# Patient Record
Sex: Female | Born: 1970 | Race: White | Hispanic: No | Marital: Married | Smoking: Never smoker
Health system: Southern US, Community
[De-identification: ages and names within clinical notes are randomized; demographics above are authoritative.]

## PROBLEM LIST (undated history)

## (undated) DIAGNOSIS — F419 Anxiety disorder, unspecified: Secondary | ICD-10-CM

## (undated) DIAGNOSIS — O139 Gestational [pregnancy-induced] hypertension without significant proteinuria, unspecified trimester: Secondary | ICD-10-CM

## (undated) DIAGNOSIS — R569 Unspecified convulsions: Secondary | ICD-10-CM

## (undated) DIAGNOSIS — J189 Pneumonia, unspecified organism: Secondary | ICD-10-CM

---
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
## 2000-06-18 ENCOUNTER — Other Ambulatory Visit: Admission: RE | Admit: 2000-06-18 | Discharge: 2000-06-18 | Payer: Self-pay | Admitting: Obstetrics and Gynecology

## 2001-03-09 ENCOUNTER — Inpatient Hospital Stay (HOSPITAL_COMMUNITY): Admission: AD | Admit: 2001-03-09 | Discharge: 2001-03-09 | Payer: Self-pay | Admitting: Obstetrics and Gynecology

## 2001-03-28 ENCOUNTER — Ambulatory Visit (HOSPITAL_COMMUNITY): Admission: RE | Admit: 2001-03-28 | Discharge: 2001-03-28 | Payer: Self-pay | Admitting: Obstetrics & Gynecology

## 2001-11-06 ENCOUNTER — Other Ambulatory Visit: Admission: RE | Admit: 2001-11-06 | Discharge: 2001-11-06 | Payer: Self-pay | Admitting: Obstetrics & Gynecology

## 2002-07-02 HISTORY — PX: DILATION AND CURETTAGE OF UTERUS: SHX78

## 2003-02-02 ENCOUNTER — Other Ambulatory Visit: Admission: RE | Admit: 2003-02-02 | Discharge: 2003-02-02 | Payer: Self-pay | Admitting: Obstetrics & Gynecology

## 2004-02-15 ENCOUNTER — Other Ambulatory Visit: Admission: RE | Admit: 2004-02-15 | Discharge: 2004-02-15 | Payer: Self-pay | Admitting: Obstetrics & Gynecology

## 2005-02-20 ENCOUNTER — Other Ambulatory Visit: Admission: RE | Admit: 2005-02-20 | Discharge: 2005-02-20 | Payer: Self-pay | Admitting: Obstetrics & Gynecology

## 2007-07-03 DIAGNOSIS — J189 Pneumonia, unspecified organism: Secondary | ICD-10-CM

## 2007-07-03 HISTORY — DX: Pneumonia, unspecified organism: J18.9

## 2012-05-01 ENCOUNTER — Ambulatory Visit (HOSPITAL_COMMUNITY)
Admission: RE | Admit: 2012-05-01 | Discharge: 2012-05-01 | Disposition: A | Discharge status: Home / Self Care | Payer: BC Managed Care – PPO | Source: Ambulatory Visit | Attending: Obstetrics and Gynecology | Admitting: Obstetrics and Gynecology

## 2012-05-01 ENCOUNTER — Other Ambulatory Visit: Payer: Self-pay

## 2012-05-01 DIAGNOSIS — Z810 Family history of intellectual disabilities: Secondary | ICD-10-CM | POA: Insufficient documentation

## 2012-05-01 DIAGNOSIS — O09519 Supervision of elderly primigravida, unspecified trimester: Secondary | ICD-10-CM | POA: Insufficient documentation

## 2012-05-02 ENCOUNTER — Telehealth (HOSPITAL_COMMUNITY): Payer: Self-pay | Admitting: MS"

## 2012-05-02 ENCOUNTER — Other Ambulatory Visit (HOSPITAL_COMMUNITY): Payer: Self-pay | Admitting: Obstetrics and Gynecology

## 2012-05-02 DIAGNOSIS — Z810 Family history of intellectual disabilities: Secondary | ICD-10-CM | POA: Insufficient documentation

## 2012-05-02 DIAGNOSIS — O09519 Supervision of elderly primigravida, unspecified trimester: Secondary | ICD-10-CM | POA: Insufficient documentation

## 2012-05-02 DIAGNOSIS — O09529 Supervision of elderly multigravida, unspecified trimester: Secondary | ICD-10-CM

## 2012-05-02 NOTE — Telephone Encounter (Signed)
Called patient to discuss available dates to schedule chorionic villus sampling. Left message for patient to return call.   Grace Carter 3:11 PM 05/02/2012

## 2012-05-02 NOTE — Telephone Encounter (Signed)
Patient returned call regarding scheduling chorionic villus sampling (CVS) procedure in pregnancy. Discussed that first available date within the correct gestational age is 11/26 in [place]. Discussed that can be scheduled for afternoon. However, having procedure performed in the morning would make it more likely to have FISH result back next day. If performed in the afternoon, could still potentially have result back next day, but also possible that it would be within two days, which would be three days given that this falls on the Thanksgiving holiday. Discussed that there are a couple dates that Dr. Otho Perl would be available the week after, but the patient had indicated that she would like to obtain this information soon, if possible.  [name] planned to investigate what she could arrange at work and call back on Monday to schedule CVS.   [name] 05/02/2012 3:47 PM

## 2012-05-05 NOTE — Progress Notes (Signed)
Genetic Counseling  High-Risk Gestation Note  Appointment Date:  05/01/2012 Referred By: Grace Aspen, DO Date of Birth:  19-Jun-1971 Partner:  Grace Carter    Pregnancy History: [id number] Estimated Date of Delivery: 12/18/2012 Estimated Gestational Age: [redacted]w[redacted]d Attending: Particia Nearing, Md    [name] and her husband, Mr. Grace Carter, were seen for genetic counseling regarding a maternal age of 41 y.o.Marland Kitchen  They were counseled regarding maternal age and the association with risk for chromosome conditions due to nondisjunction with aging of the ova.   We reviewed chromosomes, nondisjunction, and the associated 1 in 22 risk for fetal aneuploidy at [redacted]w[redacted]d gestation related to a maternal age of 41 y.o. at delivery.  They were counseled that the risk for aneuploidy decreases as gestational age increases, accounting for those pregnancies which spontaneously abort.  We specifically discussed Down syndrome (trisomy 58), trisomies 30 and 59, and sex chromosome aneuploidies (47,XXX and 47,XXY) including the common features and prognoses of each.   We reviewed available screening options including noninvasive prenatal testing (NIPT), first trimester screening, and detailed ultrasound.  Specifically, we discussed that NIPT analyzes cell free fetal DNA found in the maternal circulation starting at approximately [redacted] weeks gestation. This test is not diagnostic for chromosome conditions, but can provide information regarding the presence or absence of extra fetal DNA for chromosomes 13, 18, 21, X, and Y, and missing fetal DNA for chromosome X and Y (Turner syndrome). Thus, it would not identify or rule out all genetic conditions. The reported detection rate is greater than 99% for Trisomy 21, greater than 98% for Trisomy 18, and is approximately 80% (8 out of 10) for Trisomy 13. The false positive rate is reported to be less than 0.1% for any of these conditions.  In addition, we discussed that ~50-80% of  fetuses with Down syndrome and up to 90-95% of fetuses with trisomy 18/13, when well visualized, have detectable anomalies or soft markers by detailed ultrasound (~18+ weeks gestation).   This couple was also counseled regarding diagnostic testing via chorionic villus sampling and amniocentesis for fetal chromosome conditions.  We reviewed the approximate 1 in 100 risk for complications associated with CVS and approximate 1 in 300-500 risk for complications associated with amniocentesis, including spontaneous pregnancy loss. We discussed the associated risk for maternal cell contamination and confined placental mosaicism. The couple understands that CVS and amniocentesis cannot diagnose or rule out all birth defects or genetic conditions and that standard karyotype analysis is not assessing for single gene conditions.  After consideration of all the options, the couple elected to proceed with CVS for karyotype analysis, which was scheduled for Tuesday, 05/27/12.   [name] was provided with written information regarding cystic fibrosis (CF) including the carrier frequency and incidence in the Caucasian population, the availability of carrier testing and prenatal diagnosis if indicated.  In addition, we discussed that CF is routinely screened for as part of the [facility] newborn screening panel.  She elected to proceed with CF carrier screening at the time of today's visit.    Both family histories were reviewed and found to be contributory for mental retardation for the patient's maternal great-uncle (her maternal grandmother's brother). She reported that he was able to communicate with others. He was otherwise healthy, and an underlying cause for his mental retardation was not known. We discussed that there are many different causes of mental retardation such as genetic differences, sporadic causes, or injuries.  A specific diagnosis for mental retardation can  be determined in approximately 50% of cases.  In the  remaining 50% of cases, a diagnosis may not ever be determined.  Without more specific information, potential genetic risks cannot be determined. We discussed that carrier screening for fragile X syndrome is available for the patient, which is a common type of inherited mental retardation syndrome that may also present as developmental delays and/or autism and is more common in males.  Affected males are commonly related through (unaffected or mildly affected) carrier females.  Carrier screening is used to help identify those individuals that may be at increased risk to have affected children, although the information it can provided is limited or questionable in some instances and has the potential to reveal personal predispositions for some health problems in carriers that some people may or may not want to know about themselves.  After careful consideration, [name] elected to proceed with Fragile X carrier screening at the time of today's visit.   Additionally, the patient reported her paternal grandparents possibly had a history of three pregnancy losses. An underlying cause was not known. Approximately 1 in 6 confirmed pregnancies results in miscarriage. A single underlying cause is more likely to be suspected when a couple has experienced 3 or more losses. It is less likely that there will be an identifiable single underlying cause when a couple has experienced less than 3 losses. Several possible causes include chromosome rearrangements, antibodies, thrombophilia, as well as maternal health conditions. A family history of recurrent miscarriage typically does not have implications for extended relatives. However, additional information is needed to accurately assess potential implications for relatives.  Without further information regarding the provided family history, an accurate genetic risk cannot be calculated. Further genetic counseling is warranted if more information is  obtained.  Carrier screening for Spinal Muscular Atrophy (SMA) was also discussed. SMA is a genetic condition that follows autosomal recessive inheritance, meaning both parents must be carriers in order for a pregnancy to have a 1 in 4 (25%) chance to inherit the condition. SMA is characterized by progressive weakness of voluntary muscles. The age of onset and severity of symptoms is variable, with the most common type leading to severe involvement by age 79 years. However, we discussed that genotype:phenotype correlation is not straightforward. Approximately 1 in 87 individuals in the Caucasian population are carriers. Carrier screening detects approximately 95% of carriers in the Caucasian population. Therefore, screening can reduce but not eliminate the chance to be a carrier. If one partner is an SMA carrier, then carrier screening would be offered to the other partner. After careful consideration, [name] elected to proceed with SMA carrier screening at the time of today's visit.   In addition to ethnicity based carrier screening, the couple inquired about additional possible genetic testing that would be available for them. We discussed a newer testing option, pan-ethnic carrier screening. This carrier screen, Inheritest offered through [employer], provides carrier screening for common disease-causing mutations of greater than 90 autosomal recessive conditions. We reviewed that the prevalence of each condition varies (and often varies with ethnicity). Thus the couples' background risk to be a carrier for each of these various conditions would range, and in some cases be very low or unknown. Similarly, the detection rate varies with each condition and also varies in some cases with ethnicity, ranging from greater than 99% (in the case of Hemoglobinopathies) to 11% to unknown. We reviewed that a negative carrier screen would thus reduce but not eliminate  the chance to be a carrier for  these conditions.  For some conditions included on this pan-ethnic carrier screening panel, the pre-test carrier frequency and/or the detection rate is unknown. Thus, for some conditions included on the pan-ethnic carrier screening panel, the exact reduction of risk with a negative carrier screening result may not be able to be quantified. We reviewed that in the event that one partner is found to be a carrier for one or more conditions, carrier screening would be available to the partner for those conditions. The couple was provided with written information regarding pan-ethnic carrier screening option including a list of the conditions included on the screening panel. We discussed the risks, benefits, and limitations of carrier screening with the couple. They planned to further consider this option and declined pan-ethnic carrier screening (Inheritest) at this time.   We discussed that the turnaround time for carrier screening for CF, SMA, and Fragile X is approximately 5-8 business days. We will contact the patient when results of cystic fibrosis carrier screening, Fragile X carrier screening, and spinal muscular atrophy carrier screening are available and forward results to her OB office.   [name] denied exposure to environmental toxins or chemical agents. She denied the use of alcohol, tobacco or street drugs. She denied significant viral illnesses during the course of her pregnancy. Her medical and surgical histories were contributory for anxiety for which she is currently treated with sertraline (100 mg/day). Before assessing any risk to a pregnancy, it is important to know that any time a couple conceives, there is a 3-5% chance that the baby will be affected by a birth defect or have mental retardation. Many factors contribute to what the effects, if any, a prenatal exposure will have on a pregnancy. Based on available study data of use on Zoloft during human pregnancy, no apparent  association with increased risk of birth defects is suggested. Use of Zoloft and other serotonin reuptake inhibitors late in gestation has been associated with an increased risk of mild transient neonatal syndrome of central nervous system including irritability, vomiting, jitteriness and convulsions, as well as neonatal pulmonary hypertension. These associated symptoms typically do not appear to occur frequently or severely. Even though a limited number of medicines are known to cause birth defects, we cannot say that it is completely safe to use any medicines during pregnancy.  It is also not possible to predict any drug-drug interactions that occur, or how they might affect a pregnancy.  The use of medications is recommended in pregnancy only if the benefit to the mother (and thus the pregnancy) outweighs potential risks to the baby.  Sometimes the maternal use of medications, dictated by a medical condition, may even be more beneficial to a pregnancy than not taking the medication(s) at all. Mrs. Lasure should not alter her medication use without first consulting her physician.   I counseled this couple regarding the above risks and available options.  The approximate face-to-face time with the genetic counselor was 80 minutes.    [name], MS,  Certified Genetic Counselor 05/05/2012

## 2012-05-12 ENCOUNTER — Telehealth (HOSPITAL_COMMUNITY): Payer: Self-pay | Admitting: MS"

## 2012-05-12 NOTE — Telephone Encounter (Signed)
Jamse Belfast, husband of SMT. LODER returning my call. Patient had previously given permission to contact her husband with results of testing. Discussed that results of cystic fibrosis carrier screening, spinal muscular atrophy carrier screening, and fragile X carrier testing were all negative.   CF carrier screening was negative for the 97 mutations analyzed. This reduces Mrs. Cowies a priori risk to be a CF carrier to approximately 1 in 343, which significantly reduces the chance for CF in the pregnancy.   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 Thus, her risk to be a carrier of SMA is reduced to approximately 1 in 834.   Fragile X carrier screening was negative for the fragile X expansion mutation.   All questions were answered to Mr. Lentz's satisfaction. He and his wife were encouraged to call back with additional questions or concerns.   Clydie Braun Mariel Lukins 05/12/2012 4:53 PM

## 2012-05-12 NOTE — Telephone Encounter (Signed)
Left message for patient to return call.   Grace Carter 05/12/2012 4:48 PM

## 2012-05-20 ENCOUNTER — Other Ambulatory Visit: Payer: Self-pay

## 2012-05-20 LAB — OB RESULTS CONSOLE RUBELLA ANTIBODY, IGM: Rubella: IMMUNE

## 2012-05-20 LAB — OB RESULTS CONSOLE GC/CHLAMYDIA: Chlamydia: NEGATIVE

## 2012-05-20 LAB — OB RESULTS CONSOLE RPR: RPR: NONREACTIVE

## 2012-05-20 LAB — OB RESULTS CONSOLE HEPATITIS B SURFACE ANTIGEN: Hepatitis B Surface Ag: NEGATIVE

## 2012-05-27 ENCOUNTER — Ambulatory Visit (HOSPITAL_COMMUNITY)
Admission: RE | Admit: 2012-05-27 | Discharge: 2012-05-27 | Disposition: A | Discharge status: Home / Self Care | Payer: BC Managed Care – PPO | Source: Ambulatory Visit | Attending: Obstetrics and Gynecology | Admitting: Obstetrics and Gynecology

## 2012-05-27 ENCOUNTER — Encounter (HOSPITAL_COMMUNITY): Payer: Self-pay

## 2012-05-27 ENCOUNTER — Other Ambulatory Visit: Payer: Self-pay

## 2012-05-27 DIAGNOSIS — O09529 Supervision of elderly multigravida, unspecified trimester: Secondary | ICD-10-CM

## 2012-05-27 DIAGNOSIS — Z3689 Encounter for other specified antenatal screening: Secondary | ICD-10-CM | POA: Insufficient documentation

## 2012-05-28 ENCOUNTER — Telehealth (HOSPITAL_COMMUNITY): Payer: Self-pay | Admitting: MS"

## 2012-05-28 ENCOUNTER — Other Ambulatory Visit: Payer: Self-pay

## 2012-05-28 NOTE — Telephone Encounter (Signed)
Called Grace Carter's husband, [name], to discuss the preliminary FISH results from her CVS. Patient previously requested that her husband be contacted with testing results from CVS. We reviewed that these are within normal limits.  We again discussed the limitations of FISH and that final results are still pending and will be available in 1-2 weeks.  Patient reportedly has not noticed any concerning symptoms or complications following the procedure.  All questions were answered to the couple's satisfaction, and they were encouraged to call with additional questions or concerns.  [name], MS Certified Genetic Counselor 05/28/2012 11:31 AM

## 2012-06-03 ENCOUNTER — Telehealth (HOSPITAL_COMMUNITY): Payer: Self-pay | Admitting: MS"

## 2012-06-03 NOTE — Telephone Encounter (Signed)
Called Grace Carter's husband, Grace Carter to discuss the final karyotype results from her CVS. We reviewed that these are within normal limits.  We reviewed the options of maternal serum AFP screening in the second trimester for open neural tube defects as well as targeted ultrasound in the second trimester. We are happy to perform anatomy ultrasound, if desired. All questions were answered to his satisfaction, Grace Carter were encouraged to call with additional questions or concerns.  Grace Plowman, MS Certified Genetic Counselor 06/03/2012 3:16 PM

## 2012-10-30 ENCOUNTER — Inpatient Hospital Stay (HOSPITAL_COMMUNITY)
Admission: AD | Admit: 2012-10-30 | Discharge: 2012-10-30 | Disposition: A | Discharge status: Home / Self Care | Payer: BC Managed Care – PPO | Source: Ambulatory Visit | Attending: Obstetrics and Gynecology | Admitting: Obstetrics and Gynecology

## 2012-10-30 ENCOUNTER — Encounter (HOSPITAL_COMMUNITY): Payer: Self-pay | Admitting: *Deleted

## 2012-10-30 DIAGNOSIS — O133 Gestational [pregnancy-induced] hypertension without significant proteinuria, third trimester: Secondary | ICD-10-CM

## 2012-10-30 DIAGNOSIS — O139 Gestational [pregnancy-induced] hypertension without significant proteinuria, unspecified trimester: Secondary | ICD-10-CM | POA: Insufficient documentation

## 2012-10-30 HISTORY — DX: Gestational (pregnancy-induced) hypertension without significant proteinuria, unspecified trimester: O13.9

## 2012-10-30 LAB — COMPREHENSIVE METABOLIC PANEL
ALT: 7 U/L (ref 0–35)
Calcium: 8.7 mg/dL (ref 8.4–10.5)
Creatinine, Ser: 0.7 mg/dL (ref 0.50–1.10)
GFR calc Af Amer: >90 mL/min (ref >90)
Glucose, Bld: 71 mg/dL (ref 70–99)
Sodium: 136 mEq/L (ref 135–145)
Total Protein: 5.8 g/dL — ABNORMAL LOW (ref 6.0–8.3)

## 2012-10-30 LAB — CBC
Hemoglobin: 11.3 g/dL — ABNORMAL LOW (ref 12.0–15.0)
MCH: 27.9 pg (ref 26.0–34.0)
MCHC: 33.5 g/dL (ref 30.0–36.0)
MCV: 83.2 fL (ref 78.0–100.0)

## 2012-10-30 NOTE — MAU Note (Signed)
Pt states she was seen in the MD's.offfice and sent over for further mointioring

## 2012-10-30 NOTE — Progress Notes (Signed)
BP resulted with Dr Henderson Cloud, Pt for discharge home with a 24hour urine collection to  Start tonight and return to MAU tomorrow with collection and BP check

## 2012-10-30 NOTE — MAU Provider Note (Signed)
History     CSN: [id number]  Arrival date and time: 10/30/12 1626   None     Chief Complaint  Patient presents with  . Hypertension   HPI  Pt is a G2P0010 at 33.5 wks IUP here sent from the office for evaluation of elevated blood pressure.  Pt reports in office blood pressure was 140/90's.  Urinalysis in office negative for protein.  Reports headache last night.  No headache at this time.    No epigastic pain or vision changes.  Denies contractions, leaking of fluid, or vaginal bleeding.    Past Medical History  Diagnosis Date  . Pregnancy induced hypertension     Past Surgical History  Procedure Laterality Date  . No past surgeries      Family History  Problem Relation Age of Onset  . Hypertension Father     History  Substance Use Topics  . Smoking status: Not on file  . Smokeless tobacco: Not on file  . Alcohol Use: Not on file    Allergies:  Allergies  Allergen Reactions  . Penicillins     seizures    Prescriptions prior to admission  Medication Sig Dispense Refill  . acetaminophen (TYLENOL) 325 MG tablet Take 650 mg by mouth every 6 (six) hours as needed for pain.      . Prenatal Vit-Fe Fumarate-FA (PRENATAL MULTIVITAMIN) TABS Take 1 tablet by mouth daily at 12 noon.      . ranitidine (ZANTAC) 150 MG tablet Take 150 mg by mouth 2 (two) times daily.      . sertraline (ZOLOFT) 100 MG tablet Take 100 mg by mouth daily.        Review of Systems  Eyes: Negative.   Gastrointestinal: Negative for nausea, vomiting and abdominal pain.  Genitourinary: Negative.   Neurological: Positive for headaches (none at this time).  All other systems reviewed and are negative.   Physical Exam   Blood pressure 158/98, pulse 84, resp. rate 20, height 5\' 5"  (1.651 m), weight 123.106 kg (271 lb 6.4 oz), last menstrual period 03/08/2012, SpO2 99.00%.  Physical Exam  Constitutional: She is oriented to person, place, and time. She appears well-developed and well-nourished.  No distress.  HENT:  Head: Normocephalic.  Eyes: Pupils are equal, round, and reactive to light.  Neck: Normal range of motion. Neck supple.  Cardiovascular: Normal rate and regular rhythm.   Respiratory: Effort normal and breath sounds normal.  GI: Soft. There is no tenderness.  Genitourinary: No bleeding around the vagina.  Musculoskeletal: Normal range of motion.  2+bilat pedal  Neurological: She is alert and oriented to person, place, and time. She has normal reflexes. She displays normal reflexes.  Skin: Skin is warm and dry.   1845 FHR 130's, +accels Toco - irritability (not felt by patient)   Cervix - closed/thick MAU Course  Procedures Results for orders placed during the hospital encounter of 10/30/12 (from the past 24 hour(s))  CBC     Status: Abnormal   Collection Time    10/30/12  6:28 PM      Result Value Range   WBC 9.2  4.0 - 10.5 K/uL   RBC 4.05  3.87 - 5.11 MIL/uL   Hemoglobin 11.3 (*) 12.0 - 15.0 g/dL   HCT 40.9 (*) 81.1 - 91.4 %   MCV 83.2  78.0 - 100.0 fL   MCH 27.9  26.0 - 34.0 pg   MCHC 33.5  30.0 - 36.0 g/dL   RDW 13.0  11.5 - 15.5 %   Platelets 257  150 - 400 K/uL  COMPREHENSIVE METABOLIC PANEL     Status: Abnormal   Collection Time    10/30/12  6:28 PM      Result Value Range   Sodium 136  135 - 145 mEq/L   Potassium 4.0  3.5 - 5.1 mEq/L   Chloride 104  96 - 112 mEq/L   CO2 22  19 - 32 mEq/L   Glucose, Bld 71  70 - 99 mg/dL   BUN 9  6 - 23 mg/dL   Creatinine, Ser 9.56  0.50 - 1.10 mg/dL   Calcium 8.7  8.4 - 21.3 mg/dL   Total Protein 5.8 (*) 6.0 - 8.3 g/dL   Albumin 2.4 (*) 3.5 - 5.2 g/dL   AST 16  0 - 37 U/L   ALT 7  0 - 35 U/L   Alkaline Phosphatase 73  39 - 117 U/L   Total Bilirubin 0.3  0.3 - 1.2 mg/dL   GFR calc non Af Amer >90  >90 mL/min   GFR calc Af Amer >90  >90 mL/min   Dr. Henderson Cloud called and given report on HPI/blood pressures/lab results > discharge home and return 24 hr urine to MAU tomorrow.    Assessment and Plan   Gestational Hypertension Category I FHR Tracing  Plan: DC to home Given PreX precautions Return 24 hr urine tomorrow to MAU.  [facility] 10/30/2012, 6:16 PM

## 2012-10-30 NOTE — MAU Note (Signed)
Patient states she was sent from the office for evaluation after having elevated blood pressure. Patient states she has had a mild headache today that was relieved with Tylenol. Denies pain, contractions, bleeding or leaking and reports good fetal movement.

## 2012-10-31 ENCOUNTER — Inpatient Hospital Stay (HOSPITAL_COMMUNITY)
Admission: AD | Admit: 2012-10-31 | Discharge: 2012-10-31 | Disposition: A | Discharge status: Home / Self Care | Payer: BC Managed Care – PPO | Source: Ambulatory Visit | Attending: Obstetrics and Gynecology | Admitting: Obstetrics and Gynecology

## 2012-10-31 DIAGNOSIS — O139 Gestational [pregnancy-induced] hypertension without significant proteinuria, unspecified trimester: Secondary | ICD-10-CM

## 2012-10-31 DIAGNOSIS — O133 Gestational [pregnancy-induced] hypertension without significant proteinuria, third trimester: Secondary | ICD-10-CM

## 2012-10-31 NOTE — MAU Provider Note (Signed)
  History     CSN: [id number]  Arrival date and time: 10/31/12 2159   First Provider Initiated Contact with Patient 10/31/12 2225      No chief complaint on file.  HPI  Pt is a G2P0010 here at 33.6 wks IUP here for follow-up blood pressure check and return of 24 hr urine.  Pt off work today and states has felt great.  No report of headache, vision changes, or epigastric pain.  + fetal movement.  Denies contractions, vaginal bleeding, or leaking of fluid.  Also states swelling has decreased significantly.    Past Medical History  Diagnosis Date  . Pregnancy induced hypertension     Past Surgical History  Procedure Laterality Date  . No past surgeries      Family History  Problem Relation Age of Onset  . Hypertension Father     History  Substance Use Topics  . Smoking status: Not on file  . Smokeless tobacco: Not on file  . Alcohol Use: Not on file    Allergies:  Allergies  Allergen Reactions  . Penicillins     seizures    Prescriptions prior to admission  Medication Sig Dispense Refill  . acetaminophen (TYLENOL) 325 MG tablet Take 650 mg by mouth every 6 (six) hours as needed for pain.      . Prenatal Vit-Fe Fumarate-FA (PRENATAL MULTIVITAMIN) TABS Take 1 tablet by mouth daily at 12 noon.      . ranitidine (ZANTAC) 150 MG tablet Take 150 mg by mouth 2 (two) times daily.      . sertraline (ZOLOFT) 100 MG tablet Take 100 mg by mouth daily.        Review of Systems  Eyes: Negative.   Gastrointestinal: Negative for abdominal pain.  Neurological: Negative for headaches.  All other systems reviewed and are negative.   Physical Exam   Blood pressure 157/93, pulse 95, temperature 98.2 F (36.8 C), temperature source Oral, resp. rate 18, last menstrual period 03/08/2012, SpO2 99.00%.  Repeat BP 148/84.  Physical Exam  Constitutional: She is oriented to person, place, and time. She appears well-developed and well-nourished. No distress.  HENT:  Head:  Normocephalic.  Eyes: Pupils are equal, round, and reactive to light.  Neck: Normal range of motion. Neck supple.  Cardiovascular: Normal rate and regular rhythm.   Respiratory: Effort normal and breath sounds normal.  Genitourinary: No bleeding around the vagina.  Musculoskeletal: Normal range of motion. She exhibits edema ( +1 pedal edema).  Neurological: She is alert and oriented to person, place, and time. She has normal reflexes. She displays normal reflexes.  Skin: Skin is warm and dry.   FHR 140's, +accels, reactive Toco x 1 (not felt by patient)  Consulted with Dr. Tenny Craw > reviewed HPI/exam/blood pressures > agrees with plan to discharge home with follow-up in office and plan of care determined upon review of 24 hr urine > off work until further evaluation in office.   MAU Course  Procedures    Assessment and Plan  Gestational Hypertension  Category I FHR Tracing  Plan: DC to home  PreX precautions Follow-up in office next week Off work until further evaluation in office  [facility] 10/31/2012, 10:33 PM

## 2012-11-01 LAB — PROTEIN, URINE, 24 HOUR
Collection Interval-UPROT: 24 hours
Protein, 24H Urine: 128 mg/d — ABNORMAL HIGH (ref 50–100)
Protein, Urine: 15 mg/dL
Urine Total Volume-UPROT: 850 mL

## 2012-11-06 ENCOUNTER — Encounter (HOSPITAL_COMMUNITY): Payer: Self-pay | Admitting: *Deleted

## 2012-11-06 ENCOUNTER — Observation Stay (HOSPITAL_COMMUNITY)
Admission: AD | Admit: 2012-11-06 | Discharge: 2012-11-08 | DRG: 886 | Disposition: A | Discharge status: Home / Self Care | Payer: BC Managed Care – PPO | Source: Ambulatory Visit | Attending: Obstetrics and Gynecology | Admitting: Obstetrics and Gynecology

## 2012-11-06 DIAGNOSIS — O99210 Obesity complicating pregnancy, unspecified trimester: Secondary | ICD-10-CM | POA: Diagnosis present

## 2012-11-06 DIAGNOSIS — E669 Obesity, unspecified: Secondary | ICD-10-CM | POA: Diagnosis present

## 2012-11-06 DIAGNOSIS — O139 Gestational [pregnancy-induced] hypertension without significant proteinuria, unspecified trimester: Principal | ICD-10-CM | POA: Diagnosis present

## 2012-11-06 DIAGNOSIS — O09529 Supervision of elderly multigravida, unspecified trimester: Secondary | ICD-10-CM | POA: Diagnosis present

## 2012-11-06 DIAGNOSIS — O2692 Pregnancy related conditions, unspecified, second trimester: Secondary | ICD-10-CM

## 2012-11-06 HISTORY — DX: Anxiety disorder, unspecified: F41.9

## 2012-11-06 HISTORY — DX: Unspecified convulsions: R56.9

## 2012-11-06 HISTORY — DX: Pneumonia, unspecified organism: J18.9

## 2012-11-06 LAB — CBC
Hemoglobin: 11.7 g/dL — ABNORMAL LOW (ref 12.0–15.0)
MCH: 28.1 pg (ref 26.0–34.0)
MCV: 82.2 fL (ref 78.0–100.0)
RBC: 4.16 MIL/uL (ref 3.87–5.11)
WBC: 8.8 10*3/uL (ref 4.0–10.5)

## 2012-11-06 LAB — COMPREHENSIVE METABOLIC PANEL
AST: 18 U/L (ref 0–37)
Albumin: 2.3 g/dL — ABNORMAL LOW (ref 3.5–5.2)
BUN: 8 mg/dL (ref 6–23)
Calcium: 8.5 mg/dL (ref 8.4–10.5)
Chloride: 101 mEq/L (ref 96–112)
Creatinine, Ser: 0.74 mg/dL (ref 0.50–1.10)
Total Protein: 5.3 g/dL — ABNORMAL LOW (ref 6.0–8.3)

## 2012-11-06 LAB — TYPE AND SCREEN

## 2012-11-06 MED ORDER — PRENATAL MULTIVITAMIN CH
1.0000 | ORAL_TABLET | Freq: Every day | ORAL | Status: DC
  Administered 2012-11-07 – 2012-11-08 (×2): 1 via ORAL
  Filled 2012-11-06 (×2): qty 1

## 2012-11-06 MED ORDER — DOCUSATE SODIUM 100 MG PO CAPS: 100.0000 mg | ORAL_CAPSULE | Freq: Every day | ORAL | Status: DC

## 2012-11-06 MED ORDER — CALCIUM CARBONATE ANTACID 500 MG PO CHEW: 2.0000 | CHEWABLE_TABLET | ORAL | Status: DC | PRN

## 2012-11-06 MED ORDER — ZOLPIDEM TARTRATE 5 MG PO TABS
5.0000 mg | ORAL_TABLET | Freq: Every evening | ORAL | Status: DC | PRN
  Administered 2012-11-07: 5 mg via ORAL
  Filled 2012-11-06: qty 1

## 2012-11-06 MED ORDER — ACETAMINOPHEN 325 MG PO TABS
650.0000 mg | ORAL_TABLET | ORAL | Status: DC | PRN
  Administered 2012-11-07: 650 mg via ORAL
  Filled 2012-11-06: qty 2

## 2012-11-06 NOTE — MAU Note (Signed)
24 hour urine collection start time 1948 11/06/12

## 2012-11-06 NOTE — MAU Note (Signed)
BP elevated, was up last wk also, sent in for further eval. Has been off work for past wk, no headache, no visual changes or epigastric pain, states swelling has gone down.

## 2012-11-06 NOTE — MAU Provider Note (Signed)
Chief Complaint:  PIH eval      HPI: Grace Carter is a 42 y.o. G2P0010 at [redacted]w[redacted]d who is sent from office visit today for further evaluation of blood pressure elevation. She had trace proteinuria today. She denies headache, visual disturbance, epigastric pain. Had 24 hour 1 wk ago with 128 protein on volume 850.  Denies contractions, leakage of fluid or vaginal bleeding. Good fetal movement.   Pregnancy Course: AMA; mild obesity; on Zoloft for depression; baseline 1st tri BP 120/82; normal 1 hr OGTT. PMD Dr. Claiborne Billings  Past Medical History: Past Medical History  Diagnosis Date  . Pregnancy induced hypertension     Past obstetric history: OB History   Grav Para Term Preterm Abortions TAB SAB Ect Mult Living   2 0 0 0 1 0 1 0 0 0      # Outc Date GA Lbr Len/2nd Wgt Sex Del Anes PTL Lv   1 SAB            2 CUR               Past Surgical History: Past Surgical History  Procedure Laterality Date  . No past surgeries      Family History: Family History  Problem Relation Age of Onset  . Hypertension Father     Social History: History  Substance Use Topics  . Smoking status: Not on file  . Smokeless tobacco: Not on file  . Alcohol Use: Not on file    Allergies:  Allergies  Allergen Reactions  . Penicillins     seizures    Meds:  Prescriptions prior to admission  Medication Sig Dispense Refill  . acetaminophen (TYLENOL) 325 MG tablet Take 650 mg by mouth every 6 (six) hours as needed for pain.      . Prenatal Vit-Fe Fumarate-FA (PRENATAL MULTIVITAMIN) TABS Take 1 tablet by mouth daily at 12 noon.      . ranitidine (ZANTAC) 150 MG tablet Take 150 mg by mouth 2 (two) times daily.      . sertraline (ZOLOFT) 100 MG tablet Take 100 mg by mouth daily.        ROS: Pertinent findings in history of present illness.  Physical Exam  Blood pressure 156/100, pulse 88, temperature 98.7 F (37.1 C), temperature source Oral, resp. rate 20, last menstrual period  03/08/2012. Serial BPs: 156/100, 156/103, 163/102, 168/96, 171/93  GENERAL: Well-developed, well-nourished female in no acute distress. Anxious. HEENT: normocephalic HEART: normal rate RESP: normal effort ABDOMEN: Soft, non-tender, gravid appropriate for gestational age EXTREMITIES: Nontender, no edema NEURO: alert and oriented  FHT:  Baseline 135-140 , moderate variability, accelerations present, no decelerations Contractions: mild irreg, runs of q 4 min x 30 sec   Labs: Results for orders placed during the hospital encounter of 11/06/12 (from the past 24 hour(s))  COMPREHENSIVE METABOLIC PANEL     Status: Abnormal   Collection Time    11/06/12  4:31 PM      Result Value Range   Sodium 133 (*) 135 - 145 mEq/L   Potassium 4.2  3.5 - 5.1 mEq/L   Chloride 101  96 - 112 mEq/L   CO2 23  19 - 32 mEq/L   Glucose, Bld 76  70 - 99 mg/dL   BUN 8  6 - 23 mg/dL   Creatinine, Ser 2.95  0.50 - 1.10 mg/dL   Calcium 8.5  8.4 - 62.1 mg/dL   Total Protein 5.3 (*) 6.0 - 8.3 g/dL  Albumin 2.3 (*) 3.5 - 5.2 g/dL   AST 18  0 - 37 U/L   ALT 9  0 - 35 U/L   Alkaline Phosphatase 83  39 - 117 U/L   Total Bilirubin 0.3  0.3 - 1.2 mg/dL   GFR calc non Af Amer >90  >90 mL/min   GFR calc Af Amer >90  >90 mL/min  CBC     Status: Abnormal   Collection Time    11/06/12  4:31 PM      Result Value Range   WBC 8.8  4.0 - 10.5 K/uL   RBC 4.16  3.87 - 5.11 MIL/uL   Hemoglobin 11.7 (*) 12.0 - 15.0 g/dL   HCT 16.1 (*) 09.6 - 04.5 %   MCV 82.2  78.0 - 100.0 fL   MCH 28.1  26.0 - 34.0 pg   MCHC 34.2  30.0 - 36.0 g/dL   RDW 40.9  81.1 - 91.4 %   Platelets 235  150 - 400 K/uL  URIC ACID     Status: None   Collection Time    11/06/12  4:31 PM      Result Value Range   Uric Acid, Serum 5.7  2.4 - 7.0 mg/dL    MAU Course: BPs trending up.   Assessment: 42 yo G2P0010 at [redacted]w[redacted]d Category 1 FHR Uterine irritability GHTN vs preE with some severe range SBPs.  Plan: C/W Dr. Cordelia Poche to AN for 24 hour  urine, MFM consult, [place], serial BPs.      Danae Orleans, CNM 11/06/2012 4:32 PM

## 2012-11-07 ENCOUNTER — Inpatient Hospital Stay (HOSPITAL_COMMUNITY): Payer: BC Managed Care – PPO

## 2012-11-07 ENCOUNTER — Encounter (HOSPITAL_COMMUNITY): Payer: BC Managed Care – PPO

## 2012-11-07 LAB — ABO/RH: ABO/RH(D): A POS

## 2012-11-07 MED ORDER — SERTRALINE HCL 100 MG PO TABS
100.0000 mg | ORAL_TABLET | Freq: Every day | ORAL | Status: DC
  Administered 2012-11-07 – 2012-11-08 (×2): 100 mg via ORAL
  Filled 2012-11-07 (×2): qty 1

## 2012-11-07 MED ORDER — LABETALOL HCL 300 MG PO TABS
300.0000 mg | ORAL_TABLET | Freq: Two times a day (BID) | ORAL | Status: DC
  Administered 2012-11-07 – 2012-11-08 (×3): 300 mg via ORAL
  Filled 2012-11-07 (×3): qty 1

## 2012-11-07 MED ORDER — ONDANSETRON HCL 4 MG PO TABS: 4.0000 mg | ORAL_TABLET | ORAL | Status: DC | PRN

## 2012-11-07 MED ORDER — FAMOTIDINE 20 MG PO TABS
20.0000 mg | ORAL_TABLET | Freq: Two times a day (BID) | ORAL | Status: DC | PRN
  Administered 2012-11-07: 20 mg via ORAL
  Filled 2012-11-07: qty 1

## 2012-11-07 NOTE — Progress Notes (Signed)
UR chart review completed.  

## 2012-11-07 NOTE — Progress Notes (Signed)
Pt complains of small headache, she stated she hasn't had much rest today. Pt took Ambien and tylenol to help get rest. Pt very anxious about baby's well being, reassured pt of baby's well being.

## 2012-11-07 NOTE — H&P (Signed)
Grace Carter is a 42 y.o. female presenting for evaluation of elevated BPs  41 yo G2P0010 @ 34+[redacted] week EGA changed by a 6 wk [place] presents for evaluation of elevated BPs.  Over the last 1-2 weeks the patient has experienced a progressive increase in her blood pressures.  On 5/1 she was noted to have BPs 140-150/80/90. She completed a 24 hour urine which returned 128mg  /24 hr but was low volume at 250cc.  Pre-eclampsia labs were normal. She returned to the office today and was found to have BPs 160/100 and trace protein.  Labs again today are normal except uric acid is slightly elevated at 5.7.  She is admitted for serial BPs, 24 hour urine, MFM consultation and ultrasound.   Other than Pregnancy induced hypertension the patients pregnancy has been complicated by AMA.  She underwent CVS early in the pregnancy that was 46XX.  She has an elevated BMI of 41 at the beginning of the pregnancy.  She has experienced significant weight gain in the past few weeks with a 26 pound weight gain. This is also her weight gain for the entire pregnancy. History OB History   Grav Para Term Preterm Abortions TAB SAB Ect Mult Living   2 0 0 0 1 0 1 0 0 0      Past Medical History  Diagnosis Date  . Pregnancy induced hypertension   . Pneumonia 2009    Treated   . 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  . Anxiety     Zoloft daily   Past Surgical History  Procedure Laterality Date  . Dilation and curettage of uterus  2004    post miscarriage   Family History: family history includes Hypertension in her father. Social History:  reports that she has never smoked. She has never used smokeless tobacco. She reports that she does not drink alcohol or use illicit drugs.   Prenatal Transfer Tool  Maternal Diabetes:  1hr gtt 137, 3hr gtt WNL Genetic Screening: CVS 14 XX Maternal Ultrasounds/Referrals: Normal Fetal Ultrasounds or other Referrals:  Normal, MFM for CVS Maternal Substance Abuse:   No Significant Maternal Medications:  No Significant Maternal Lab Results:  No Other Comments:  None  ROS: as above  CBC    Component Value Date/Time   WBC 8.8 11/06/2012 1631   RBC 4.16 11/06/2012 1631   HGB 11.7* 11/06/2012 1631   HCT 34.2* 11/06/2012 1631   PLT 235 11/06/2012 1631   MCV 82.2 11/06/2012 1631   MCH 28.1 11/06/2012 1631   MCHC 34.2 11/06/2012 1631   RDW 13.1 11/06/2012 1631    CMP     Component Value Date/Time   NA 133* 11/06/2012 1631   K 4.2 11/06/2012 1631   CL 101 11/06/2012 1631   CO2 23 11/06/2012 1631   GLUCOSE 76 11/06/2012 1631   BUN 8 11/06/2012 1631   CREATININE 0.74 11/06/2012 1631   CALCIUM 8.5 11/06/2012 1631   PROT 5.3* 11/06/2012 1631   ALBUMIN 2.3* 11/06/2012 1631   AST 18 11/06/2012 1631   ALT 9 11/06/2012 1631   ALKPHOS 83 11/06/2012 1631   BILITOT 0.3 11/06/2012 1631   GFRNONAA >90 11/06/2012 1631   GFRAA >90 11/06/2012 1631        Blood pressure 159/88, pulse 85, temperature 98.2 F (36.8 C), temperature source Oral, resp. rate 20, height 5\' 5"  (1.651 m), weight 122.925 kg (271 lb), last menstrual period 03/08/2012. Exam Physical Exam  AOX3, NAD Gravid, soft FHT 140 reactive Cvx deferred toco quiet 2-3+ edema of lower extremities  Prenatal labs: ABO, Rh: --/--/A POS (05/08 2200) Antibody: NEG (05/08 2200) Rubella:  Immune  RPR:   NR HBsAg:   Neg HIV:   NR GBS:     Assessment/Plan: 1) Admit 2) MFM consult with ultrasound.   Questions for MFM   A) Should antihypertensives be utilized?   B) At what point is delivery warrentted? 3) Pt has severe range pressures but normal labs.  Her 24 hour urine was normal but low volume. Will repeat 24 hour urine and perform serial BPs.  Pt does have anxiety and this does seem to adversely affect her BPs    4) SCDs for DVT prophylaxis 5) NICU consult 6) Discussed with patient the concerns for her elevated BPs. Advised a preterm delivery may be indicated. THis caused the patient significant anxiety. Will obtain NICU  consult to give patient a realistic expectation for a baby born at 34-35 weeks.  7) Advised the patient it would be unlikely to be returning to work.  Quantarius Genrich H. 11/07/2012, 12:12 AM

## 2012-11-07 NOTE — Consult Note (Signed)
Maternal Fetal Medicine Consultation  Requesting Provider(s): Waynard Reeds, MD  Reason for consultation: Elevated blood pressures  HPI: Grace Carter is a 42 yo G2P0010 currently at 10 6/7 weeks who was seen for consultation due to new onset elevated blood pressures.  Grace Carter denies a history of chronic hypertension - her intake blood pressure was 128/82.  In the office yesterday, she was noted to have a blood pressure of 160/100.  A 24-hr urine protein collection from earlier this week showed 128 mg protein/24 hrs.  Her admission labs were within normal limits except for a uric acid of 5.7.  She is otherwise without complaints - denies HA, visual changes or RUQ pain.  The fetus is active.  The patient previously underwent CVS that revealed a 58 XX karyotype.   Her prenatal course has otherwise been uncomplicated.  OB History: OB History   Grav Para Term Preterm Abortions TAB SAB Ect Mult Living   2 0 0 0 1 0 1 0 0 0       PMH:  Past Medical History  Diagnosis Date  . Pregnancy induced hypertension   . Pneumonia 2009    Treated   . 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  . Anxiety     Zoloft daily    PSH:  Past Surgical History  Procedure Laterality Date  . Dilation and curettage of uterus  2004    post miscarriage   Meds:  Scheduled Meds: . docusate sodium  100 mg Oral Daily  . labetalol  300 mg Oral BID  . prenatal multivitamin  1 tablet Oral Q1200  . sertraline  100 mg Oral Daily   Continuous Infusions:  PRN Meds:.acetaminophen, calcium carbonate, famotidine, ondansetron, zolpidem  Allergies: PCN, Ceclor  "cold urtecaria"  FH: patient denies family history of birth defects or hereditary disorders  Soc: denies tobacco or ETOH use  Review of Systems: no vaginal bleeding or cramping/contractions, no LOF, no nausea/vomiting. All other systems reviewed and are negative.  PNL: ABO, Rh: --/--/A POS (05/08 2200)  Antibody: NEG (05/08 2200)   Rubella: Immune  RPR: NR  HBsAg: Neg  HIV: NR  GBS:  PE:   Filed Vitals:   11/07/12 1218  BP: 159/88  Pulse: 77  Temp:   Resp: 20  Blood pressures: 171/95, 166/92, 159/88, 164/94, 161/95  GEN: well-appearing female ABD: gravid, NT  Ultrasound: Single IUP at 34 6/7 weeks Somewhat limited views of the fetal anatomy due to late gestatonal age and maternal body habitus Fetal growth is appropriate (80th %tile) Normal amniotic fluid volume  Labs: CBC    Component Value Date/Time   WBC 8.8 11/06/2012 1631   RBC 4.16 11/06/2012 1631   HGB 11.7* 11/06/2012 1631   HCT 34.2* 11/06/2012 1631   PLT 235 11/06/2012 1631   MCV 82.2 11/06/2012 1631   MCH 28.1 11/06/2012 1631   MCHC 34.2 11/06/2012 1631   RDW 13.1 11/06/2012 1631   CMP     Component Value Date/Time   NA 133* 11/06/2012 1631   K 4.2 11/06/2012 1631   CL 101 11/06/2012 1631   CO2 23 11/06/2012 1631   GLUCOSE 76 11/06/2012 1631   BUN 8 11/06/2012 1631   CREATININE 0.74 11/06/2012 1631   CALCIUM 8.5 11/06/2012 1631   PROT 5.3* 11/06/2012 1631   ALBUMIN 2.3* 11/06/2012 1631   AST 18 11/06/2012 1631   ALT 9 11/06/2012 1631   ALKPHOS 83 11/06/2012 1631  BILITOT 0.3 11/06/2012 1631   GFRNONAA >90 11/06/2012 1631   GFRAA >90 11/06/2012 1631      A/P: 1) Single IUP at 34 6/7 weeks         2) Severe gestational hypertension - with a normal 24-hr urine protein and no other evidence of end-organ disease by labs a present, do not feel that expedient delivery is indicated currently.  Recommend initiating Labetolol 300 mg BID - would titrate with a goal of achieving blood pressures in the 140/90 range.  If able to control blood pressures, would consider managing patient as an outpatient.  Recommend 2x weekly NSTs with weekly fluid checks and would check weekly preeclampsia labs (as outpatient).    If the patient does not meet criteria for severe preeclampsia (elevated LFTs, plts <100K, severe HA) and the patient continues to have elevated blood pressures - would  recommend delivery at 37 weeks.   Thank you for the opportunity to be a part of the care of Grace Carter. Please contact our office if we can be of further assistance.   I spent approximately 30 minutes with this patient with over 50% of time spent in face-to-face counseling.  Alpha Gula, MD Maternal Fetal Medicine

## 2012-11-07 NOTE — Progress Notes (Signed)
Patient is doing well.  Feels fine.  No headache.  BP over last 6 hours 158-161/88-92.  On Labetalol 300 mg q 12 h. (1st dose at 10 am today). Ultrasound shows EFW in 80%ile, AFI 16.  FHT is reactive. 24 hour urine will be done tonight.  Appreciate Dr. Fredda Hammed consult note.  Will continue hospital observation.  If BPs remain in her present range and 24 hour protein is < 300 mg, will discharge home tomorrow morning and follow in office semiweekly.

## 2012-11-08 LAB — PROTEIN, URINE, 24 HOUR
Protein, 24H Urine: 192 mg/d — ABNORMAL HIGH (ref 50–100)
Protein, Urine: 16 mg/dL
Urine Total Volume-UPROT: 1200 mL

## 2012-11-08 MED ORDER — LABETALOL HCL 300 MG PO TABS: 300.0000 mg | ORAL_TABLET | Freq: Two times a day (BID) | ORAL | Status: AC

## 2012-11-08 NOTE — Progress Notes (Signed)
Patient feels fine.  Eager to go home.  BP  150's/90 range 24 hour protein 198 mg. NST reactive   Imp:  PIH without proteinuria.  BP responding to Labetalol and bedrest.  Plan:  Home.  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  No heavy housework or activities outside the home.  Return to office 5/12 for NST and BP check.  Continue Labetalol 300 mg twice daily.

## 2012-11-08 NOTE — Discharge Summary (Signed)
  Diagnosis:  PIH Procedures: Hospital observation; MFM Consult Hospital course:  Observed for possible severe preeclampsia.   Discharged:  Regular diet, decreased activity, Labetalol, RTO 4 days.

## 2012-11-10 NOTE — Progress Notes (Signed)
Post discharge chart review completed.  

## 2012-11-18 ENCOUNTER — Inpatient Hospital Stay (HOSPITAL_COMMUNITY)
Admission: AD | Admit: 2012-11-18 | Discharge: 2012-11-23 | DRG: 651 | Disposition: A | Discharge status: Home / Self Care | Payer: BC Managed Care – PPO | Source: Ambulatory Visit | Attending: Obstetrics and Gynecology | Admitting: Obstetrics and Gynecology

## 2012-11-18 ENCOUNTER — Encounter (HOSPITAL_COMMUNITY): Payer: Self-pay | Admitting: *Deleted

## 2012-11-18 DIAGNOSIS — IMO0002 Reserved for concepts with insufficient information to code with codable children: Principal | ICD-10-CM | POA: Diagnosis present

## 2012-11-18 DIAGNOSIS — O1493 Unspecified pre-eclampsia, third trimester: Secondary | ICD-10-CM

## 2012-11-18 DIAGNOSIS — I10 Essential (primary) hypertension: Secondary | ICD-10-CM

## 2012-11-18 LAB — COMPREHENSIVE METABOLIC PANEL
ALT: 17 U/L (ref 0–35)
Alkaline Phosphatase: 78 U/L (ref 39–117)
CO2: 22 mEq/L (ref 19–32)
GFR calc Af Amer: >90 mL/min (ref >90)
GFR calc non Af Amer: 78 mL/min — ABNORMAL LOW (ref >90)
Glucose, Bld: 99 mg/dL (ref 70–99)
Potassium: 3.9 mEq/L (ref 3.5–5.1)
Sodium: 134 mEq/L — ABNORMAL LOW (ref 135–145)

## 2012-11-18 LAB — TYPE AND SCREEN

## 2012-11-18 LAB — PROTEIN / CREATININE RATIO, URINE: Protein Creatinine Ratio: 2.47 — ABNORMAL HIGH (ref 0.00–0.15)

## 2012-11-18 LAB — CBC
Hemoglobin: 11.3 g/dL — ABNORMAL LOW (ref 12.0–15.0)
RBC: 4.09 MIL/uL (ref 3.87–5.11)
WBC: 9.4 10*3/uL (ref 4.0–10.5)

## 2012-11-18 MED ORDER — CITRIC ACID-SODIUM CITRATE 334-500 MG/5ML PO SOLN
30.0000 mL | ORAL | Status: DC | PRN
  Administered 2012-11-19 – 2012-11-20 (×3): 30 mL via ORAL
  Filled 2012-11-18 (×3): qty 15

## 2012-11-18 MED ORDER — ONDANSETRON HCL 4 MG/2ML IJ SOLN
4.0000 mg | Freq: Four times a day (QID) | INTRAMUSCULAR | Status: DC | PRN
  Administered 2012-11-19: 4 mg via INTRAVENOUS
  Filled 2012-11-18: qty 2

## 2012-11-18 MED ORDER — BUTORPHANOL TARTRATE 1 MG/ML IJ SOLN
1.0000 mg | INTRAMUSCULAR | Status: DC | PRN
  Administered 2012-11-19: 1 mg via INTRAVENOUS
  Filled 2012-11-18: qty 1

## 2012-11-18 MED ORDER — LACTATED RINGERS IV SOLN
500.0000 mL | INTRAVENOUS | Status: DC | PRN
  Administered 2012-11-19 – 2012-11-20 (×2): 500 mL via INTRAVENOUS

## 2012-11-18 MED ORDER — MISOPROSTOL 25 MCG QUARTER TABLET
25.0000 ug | ORAL_TABLET | ORAL | Status: DC | PRN
  Administered 2012-11-18 – 2012-11-19 (×3): 25 ug via VAGINAL
  Filled 2012-11-18 (×3): qty 0.25

## 2012-11-18 MED ORDER — TERBUTALINE SULFATE 1 MG/ML IJ SOLN
0.2500 mg | Freq: Once | INTRAMUSCULAR | Status: AC | PRN
  Filled 2012-11-18: qty 1

## 2012-11-18 MED ORDER — FLEET ENEMA 7-19 GM/118ML RE ENEM: 1.0000 | ENEMA | RECTAL | Status: DC | PRN

## 2012-11-18 MED ORDER — OXYCODONE-ACETAMINOPHEN 5-325 MG PO TABS: 1.0000 | ORAL_TABLET | ORAL | Status: DC | PRN

## 2012-11-18 MED ORDER — IBUPROFEN 600 MG PO TABS: 600.0000 mg | ORAL_TABLET | Freq: Four times a day (QID) | ORAL | Status: DC | PRN

## 2012-11-18 MED ORDER — OXYTOCIN 40 UNITS IN LACTATED RINGERS INFUSION - SIMPLE MED: 62.5000 mL/h | INTRAVENOUS | Status: DC

## 2012-11-18 MED ORDER — ACETAMINOPHEN 325 MG PO TABS
650.0000 mg | ORAL_TABLET | ORAL | Status: DC | PRN
  Administered 2012-11-18 – 2012-11-19 (×2): 650 mg via ORAL
  Filled 2012-11-18 (×2): qty 2

## 2012-11-18 MED ORDER — ZOLPIDEM TARTRATE 5 MG PO TABS
5.0000 mg | ORAL_TABLET | Freq: Every evening | ORAL | Status: DC | PRN
  Administered 2012-11-18: 5 mg via ORAL
  Filled 2012-11-18: qty 1

## 2012-11-18 MED ORDER — MAGNESIUM SULFATE BOLUS VIA INFUSION
4.0000 g | Freq: Once | INTRAVENOUS | Status: AC
  Administered 2012-11-18: 4 g via INTRAVENOUS
  Filled 2012-11-18: qty 500

## 2012-11-18 MED ORDER — MAGNESIUM SULFATE 40 G IN LACTATED RINGERS - SIMPLE
2.0000 g/h | INTRAVENOUS | Status: DC
  Administered 2012-11-18 – 2012-11-19 (×2): 2 g/h via INTRAVENOUS
  Filled 2012-11-18 (×2): qty 500

## 2012-11-18 MED ORDER — LABETALOL HCL 300 MG PO TABS
300.0000 mg | ORAL_TABLET | Freq: Two times a day (BID) | ORAL | Status: DC
  Administered 2012-11-18 – 2012-11-19 (×3): 300 mg via ORAL
  Filled 2012-11-18 (×5): qty 1

## 2012-11-18 MED ORDER — OXYTOCIN BOLUS FROM INFUSION: 500.0000 mL | INTRAVENOUS | Status: DC

## 2012-11-18 MED ORDER — LIDOCAINE HCL (PF) 1 % IJ SOLN: 30.0000 mL | INTRAMUSCULAR | Status: DC | PRN

## 2012-11-18 MED ORDER — LACTATED RINGERS IV SOLN
INTRAVENOUS | Status: DC
  Administered 2012-11-18 – 2012-11-19 (×4): via INTRAVENOUS

## 2012-11-18 NOTE — H&P (Signed)
42 y.o. [redacted]w[redacted]d  G2P0010 with CHTN and preeclampsia. Pt has been monitored for several weeks now.  Labs have been normal and last 24 hour urine showed 124 mg protein.  Grace Carter was recently admitted for observation a week ago and her blood pressure medicine was increased.  Grace Carter responded.  Today, the pt came in for a routine visit and was found to have increased proteinuria to 2+.  Her BPs on office were 160-170s/100-110s.  Grace Carter was sent for eval at MAU.  Although her labs were essentially normal, her creatinine was elevated and her BPs remained in the severe range. Grace Carter has 2 beats clonus and is hyperreflexic per NP. Otherwise has good fetal movement and no bleeding.  Pt did admit to chest tightness and vomiting yesterday but both sx now gone.  Past Medical History  Diagnosis Date  . Pregnancy induced hypertension   . Pneumonia 2009    Treated   . 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  . Anxiety     Zoloft daily    Past Surgical History  Procedure Laterality Date  . Dilation and curettage of uterus  2004    post miscarriage    OB History   Grav Para Term Preterm Abortions TAB SAB Ect Mult Living   2 0 0 0 1 0 1 0 0 0      # Outc Date GA Lbr Len/2nd Wgt Sex Del Anes PTL Lv   1 SAB            2 CUR               History   Social History  . Marital Status: Married    Spouse Name: N/A    Number of Children: N/A  . Years of Education: N/A   Occupational History  . Not on file.   Social History Main Topics  . Smoking status: Never Smoker   . Smokeless tobacco: Never Used  . Alcohol Use: No  . Drug Use: No  . Sexually Active: Not Currently   Other Topics Concern  . Not on file   Social History Narrative  . No narrative on file   Penicillins; Ceclor; and Other    Prenatal Transfer Tool  Maternal Diabetes: No- pt passed 3 hour gtt Genetic Screening: Normal- CVS 69 XY Maternal Ultrasounds/Referrals: Abnormal:  Findings:   Other:Preeclampsia.   Fetal  Ultrasounds or other Referrals:  Referred to Materal Fetal Medicine - normal, last EFW  Maternal Substance Abuse:  No Significant Maternal Medications:  Meds include: Other: labetalol Significant Maternal Lab Results: Lab values include: Other: increased Cr.  Other ZOX:[id number] and PIH.    Filed Vitals:   11/18/12 1903 11/18/12 1918 11/18/12 2045 11/18/12 2108  BP: 175/92 167/97  174/105  Pulse: 79 76  81  Height:   5\' 5"  (1.651 m)   Weight:   119.296 kg (263 lb)    Lungs/Cor:  Clear to ascultation, RRR Abdomen:  soft, gravid Ex:  no cords, erythema SVE:  Cl/th/high FHTs:  130s, good STV, NST R Toco:  q occ   A/P   [redacted]w[redacted]d with severe preeclampsia.  Continue labetalol for BP control- will add IV if necessary.  Magnesium sulfate.   GBS NEG.  Induce with cytotec.  Samora Jernberg A

## 2012-11-18 NOTE — Progress Notes (Signed)
Pt states she had a headache yesterday and state she "doen't really" have a headache today,Pt rates headache a 1.

## 2012-11-18 NOTE — MAU Note (Signed)
Pt was seen in the office today and BP was elevated

## 2012-11-18 NOTE — MAU Provider Note (Signed)
History     CSN: [id number]  Arrival date and time: 11/18/12 1629   None     Chief Complaint  Patient presents with  . Hypertension   HPI This is a 42 y.o. female at [redacted]w[redacted]d who presents for evaluation of hypertension in pregnancy. Denies headache today. Denies visual changes. States edema is better. Has been followed for this for a while now. High anxiety over this and Very high anxiety over delivery before 38 weeks.  Does not want to be admitted.   RN Note: Pt was seen in the office today and BP was elevated      OB History   Grav Para Term Preterm Abortions TAB SAB Ect Mult Living   2 0 0 0 1 0 1 0 0 0       Past Medical History  Diagnosis Date  . Pregnancy induced hypertension   . Pneumonia 2009    Treated   . 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  . Anxiety     Zoloft daily    Past Surgical History  Procedure Laterality Date  . Dilation and curettage of uterus  2004    post miscarriage    Family History  Problem Relation Age of Onset  . Hypertension Father     History  Substance Use Topics  . Smoking status: Never Smoker   . Smokeless tobacco: Never Used  . Alcohol Use: No    Allergies:  Allergies  Allergen Reactions  . Penicillins     seizures  . Ceclor (Cefaclor) Itching  . Other Hives    Cold sensation; ex: in contact with ice, cold water, anything cold that touches any part of her body, causes skin to blister, hives, hot to touch.    Prescriptions prior to admission  Medication Sig Dispense Refill  . acetaminophen (TYLENOL) 325 MG tablet Take 650 mg by mouth every 6 (six) hours as needed for pain.      Marland Kitchen labetalol (NORMODYNE) 300 MG tablet Take 1 tablet (300 mg total) by mouth 2 (two) times daily.  60 tablet  3  . Prenatal Vit-Fe Fumarate-FA (PRENATAL MULTIVITAMIN) TABS Take 1 tablet by mouth at bedtime.       . ranitidine (ZANTAC) 150 MG tablet Take 150 mg by mouth 2 (two) times daily as needed for heartburn.        . sertraline (ZOLOFT) 100 MG tablet Take 100 mg by mouth every morning.         Review of Systems  Constitutional: Negative for fever, chills and malaise/fatigue.  Eyes: Negative for blurred vision and double vision.  Cardiovascular: Positive for leg swelling. Negative for chest pain.  Gastrointestinal: Negative for nausea, vomiting, abdominal pain, diarrhea and constipation.  Genitourinary: Negative for dysuria.  Neurological: Negative for dizziness, focal weakness, seizures, weakness and headaches.  Psychiatric/Behavioral: The patient is nervous/anxious.    Physical Exam   Blood pressure 180/97, pulse 77, last menstrual period 03/08/2012.  Filed Vitals:   11/18/12 1833 11/18/12 1848 11/18/12 1903 11/18/12 1918  BP: 182/107 184/109 175/92 167/97  Pulse: 79 83 79 76    Physical Exam  Constitutional: She appears well-developed and well-nourished. She appears distressed (with anxiety over being admitted).  HENT:  Head: Normocephalic.  Neck: Normal range of motion.  Cardiovascular: Normal rate.   Respiratory: Effort normal.  GI: Soft. She exhibits no distension. There is no tenderness. There is no rebound and no guarding.  Musculoskeletal: Normal range of  motion. She exhibits edema (2+).  Neurological: She displays abnormal reflex (3+ DTRs with 2 strong beats of clonus).  Skin: Skin is warm and dry.  Psychiatric: She has a normal mood and affect.    MAU Course  Procedures  MDM Results for orders placed during the hospital encounter of 11/18/12 (from the past 24 hour(s))  CBC     Status: Abnormal   Collection Time    11/18/12  5:00 PM      Result Value Range   WBC 9.4  4.0 - 10.5 K/uL   RBC 4.09  3.87 - 5.11 MIL/uL   Hemoglobin 11.3 (*) 12.0 - 15.0 g/dL   HCT 14.7 (*) 82.9 - 56.2 %   MCV 81.9  78.0 - 100.0 fL   MCH 27.6  26.0 - 34.0 pg   MCHC 33.7  30.0 - 36.0 g/dL   RDW 13.0  86.5 - 78.4 %   Platelets 193  150 - 400 K/uL  COMPREHENSIVE METABOLIC PANEL     Status:  Abnormal   Collection Time    11/18/12  5:00 PM      Result Value Range   Sodium 134 (*) 135 - 145 mEq/L   Potassium 3.9  3.5 - 5.1 mEq/L   Chloride 103  96 - 112 mEq/L   CO2 22  19 - 32 mEq/L   Glucose, Bld 99  70 - 99 mg/dL   BUN 9  6 - 23 mg/dL   Creatinine, Ser 6.96  0.50 - 1.10 mg/dL   Calcium 8.1 (*) 8.4 - 10.5 mg/dL   Total Protein 5.5 (*) 6.0 - 8.3 g/dL   Albumin 2.1 (*) 3.5 - 5.2 g/dL   AST 33  0 - 37 U/L   ALT 17  0 - 35 U/L   Alkaline Phosphatase 78  39 - 117 U/L   Total Bilirubin 0.4  0.3 - 1.2 mg/dL   GFR calc non Af Amer 78 (*) >90 mL/min   GFR calc Af Amer >90  >90 mL/min  URIC ACID     Status: None   Collection Time    11/18/12  5:00 PM      Result Value Range   Uric Acid, Serum 7.0  2.4 - 7.0 mg/dL  PROTEIN / CREATININE RATIO, URINE     Status: Abnormal   Collection Time    11/18/12  5:10 PM      Result Value Range   Creatinine, Urine 124.51     Total Protein, Urine 307     PROTEIN CREATININE RATIO 2.47 (*) 0.00 - 0.15     Assessment and Plan  A:  SIUP at [redacted]w[redacted]d       Preeclampsia  P:  Discussed with Dr Henderson Cloud.       Admit        Magnesium Sulfate      Neonatology consult to reassure patient  [facility] 11/18/2012, 6:36 PM

## 2012-11-19 ENCOUNTER — Encounter (HOSPITAL_COMMUNITY): Payer: Self-pay | Admitting: Anesthesiology

## 2012-11-19 ENCOUNTER — Inpatient Hospital Stay (HOSPITAL_COMMUNITY): Payer: BC Managed Care – PPO

## 2012-11-19 LAB — CBC
HCT: 35.4 % — ABNORMAL LOW (ref 36.0–46.0)
MCV: 81.6 fL (ref 78.0–100.0)
RBC: 4.34 MIL/uL (ref 3.87–5.11)
WBC: 10.6 10*3/uL — ABNORMAL HIGH (ref 4.0–10.5)

## 2012-11-19 MED ORDER — FENTANYL 2.5 MCG/ML BUPIVACAINE 1/10 % EPIDURAL INFUSION (WH - ANES)
INTRAMUSCULAR | Status: DC | PRN
  Administered 2012-11-19: 14 mL/h via EPIDURAL

## 2012-11-19 MED ORDER — LABETALOL HCL 5 MG/ML IV SOLN
20.0000 mg | Freq: Once | INTRAVENOUS | Status: AC
  Administered 2012-11-19: 20 mg via INTRAVENOUS
  Filled 2012-11-19: qty 4

## 2012-11-19 MED ORDER — TERBUTALINE SULFATE 1 MG/ML IJ SOLN: 0.2500 mg | Freq: Once | INTRAMUSCULAR | Status: AC | PRN

## 2012-11-19 MED ORDER — FENTANYL 2.5 MCG/ML BUPIVACAINE 1/10 % EPIDURAL INFUSION (WH - ANES)
14.0000 mL/h | INTRAMUSCULAR | Status: DC | PRN
  Administered 2012-11-19: 14 mL/h via EPIDURAL
  Filled 2012-11-19 (×2): qty 125

## 2012-11-19 MED ORDER — LACTATED RINGERS IV SOLN
INTRAVENOUS | Status: DC
  Administered 2012-11-19 – 2012-11-20 (×3): via INTRAUTERINE

## 2012-11-19 MED ORDER — EPHEDRINE 5 MG/ML INJ: 10.0000 mg | INTRAVENOUS | Status: DC | PRN

## 2012-11-19 MED ORDER — HYDRALAZINE HCL 20 MG/ML IJ SOLN
5.0000 mg | Freq: Once | INTRAMUSCULAR | Status: AC
  Administered 2012-11-19: 5 mg via INTRAVENOUS
  Filled 2012-11-19: qty 1

## 2012-11-19 MED ORDER — SERTRALINE HCL 100 MG PO TABS
100.0000 mg | ORAL_TABLET | Freq: Every day | ORAL | Status: DC
  Administered 2012-11-19: 100 mg via ORAL
  Filled 2012-11-19 (×2): qty 1

## 2012-11-19 MED ORDER — MAGNESIUM SULFATE 40 G IN LACTATED RINGERS - SIMPLE
2.0000 g/h | INTRAVENOUS | Status: DC
  Filled 2012-11-19: qty 500

## 2012-11-19 MED ORDER — LACTATED RINGERS IV SOLN
500.0000 mL | Freq: Once | INTRAVENOUS | Status: AC
  Administered 2012-11-19: 500 mL via INTRAVENOUS

## 2012-11-19 MED ORDER — DIPHENHYDRAMINE HCL 50 MG/ML IJ SOLN: 12.5000 mg | INTRAMUSCULAR | Status: DC | PRN

## 2012-11-19 MED ORDER — OXYTOCIN 40 UNITS IN LACTATED RINGERS INFUSION - SIMPLE MED
1.0000 m[IU]/min | INTRAVENOUS | Status: DC
  Administered 2012-11-19: 2 m[IU]/min via INTRAVENOUS
  Filled 2012-11-19: qty 1000

## 2012-11-19 MED ORDER — EPHEDRINE 5 MG/ML INJ
10.0000 mg | INTRAVENOUS | Status: DC | PRN
  Filled 2012-11-19: qty 4

## 2012-11-19 MED ORDER — PHENYLEPHRINE 40 MCG/ML (10ML) SYRINGE FOR IV PUSH (FOR BLOOD PRESSURE SUPPORT)
80.0000 ug | PREFILLED_SYRINGE | INTRAVENOUS | Status: DC | PRN
  Filled 2012-11-19: qty 5

## 2012-11-19 MED ORDER — FAMOTIDINE 20 MG PO TABS
20.0000 mg | ORAL_TABLET | Freq: Two times a day (BID) | ORAL | Status: DC | PRN
  Administered 2012-11-19: 20 mg via ORAL
  Filled 2012-11-19: qty 1

## 2012-11-19 MED ORDER — LIDOCAINE HCL (PF) 1 % IJ SOLN
INTRAMUSCULAR | Status: DC | PRN
  Administered 2012-11-19 (×2): 4 mL

## 2012-11-19 MED ORDER — PHENYLEPHRINE 40 MCG/ML (10ML) SYRINGE FOR IV PUSH (FOR BLOOD PRESSURE SUPPORT): 80.0000 ug | PREFILLED_SYRINGE | INTRAVENOUS | Status: DC | PRN

## 2012-11-19 NOTE — Anesthesia Preprocedure Evaluation (Signed)
Anesthesia Evaluation  Patient identified by MRN, date of birth, ID band Patient awake    Reviewed: Allergy & Precautions, H&P , NPO status , Patient's Chart, lab work & pertinent test results  Airway Mallampati: III TM Distance: >3 FB Neck ROM: Full    Dental no notable dental hx. (+) Teeth Intact   Pulmonary pneumonia -, resolved,  breath sounds clear to auscultation  Pulmonary exam normal       Cardiovascular hypertension, Pt. on medications and Pt. on home beta blockers Rhythm:Regular Rate:Normal  Severe preecclampsia   Neuro/Psych Seizures -,  Anxiety Last Sz in 1973    GI/Hepatic Neg liver ROS, GERD-  Medicated and Controlled,  Endo/Other  Morbid obesity  Renal/GU negative Renal ROS  negative genitourinary   Musculoskeletal negative musculoskeletal ROS (+)   Abdominal (+) + obese,   Peds  Hematology negative hematology ROS (+)   Anesthesia Other Findings   Reproductive/Obstetrics (+) Pregnancy 36 weeks Severe preecclampsia                           Anesthesia Physical Anesthesia Plan  ASA: III  Anesthesia Plan: Epidural   Post-op Pain Management:    Induction:   Airway Management Planned: Natural Airway  Additional Equipment:   Intra-op Plan:   Post-operative Plan:   Informed Consent: I have reviewed the patients History and Physical, chart, labs and discussed the procedure including the risks, benefits and alternatives for the proposed anesthesia with the patient or authorized representative who has indicated his/her understanding and acceptance.   Dental advisory given  Plan Discussed with: Anesthesiologist  Anesthesia Plan Comments:         Anesthesia Quick Evaluation

## 2012-11-19 NOTE — Anesthesia Procedure Notes (Signed)
Epidural Patient location during procedure: OB Start time: 11/19/2012 2:33 PM  Staffing Anesthesiologist: Virtie Bungert A. Performed by: anesthesiologist   Preanesthetic Checklist Completed: patient identified, site marked, surgical consent, pre-op evaluation, timeout performed, IV checked, risks and benefits discussed and monitors and equipment checked  Epidural Patient position: sitting Prep: site prepped and draped and DuraPrep Patient monitoring: continuous pulse ox and blood pressure Approach: midline Injection technique: LOR air  Needle:  Needle type: Tuohy  Needle gauge: 17 G Needle length: 9 cm and 9 Needle insertion depth: 8 cm Catheter type: closed end flexible Catheter size: 19 Gauge Catheter at skin depth: 14 cm Test dose: negative and Other  Assessment Events: blood not aspirated, injection not painful, no injection resistance, negative IV test and no paresthesia  Additional Notes Patient identified. Risks and benefits discussed including failed block, incomplete  Pain control, post dural puncture headache, nerve damage, paralysis, blood pressure Changes, nausea, vomiting, reactions to medications-both toxic and allergic and post Partum back pain. All questions were answered. Patient expressed understanding and wished to proceed. Sterile technique was used throughout procedure. Epidural site was Dressed with sterile barrier dressing. No paresthesias, signs of intravascular injection Or signs of intrathecal spread were encountered. Attempt x 3. Difficult due to poor position. Patient was more comfortable after the epidural was dosed. Please see RN's note for documentation of vital signs and FHR which are stable.

## 2012-11-19 NOTE — Progress Notes (Signed)
Informed Dr Dareen Piano of decreased urine output, maternal vitals signs, and changes in fetal heart rate. Informed Dr Dareen Piano of OB interventions performed. Order given to start amnioinfusion and to restart mag at 2g/hr

## 2012-11-20 ENCOUNTER — Encounter (HOSPITAL_COMMUNITY): Payer: Self-pay | Admitting: Anesthesiology

## 2012-11-20 ENCOUNTER — Inpatient Hospital Stay (HOSPITAL_COMMUNITY): Payer: BC Managed Care – PPO | Admitting: Anesthesiology

## 2012-11-20 ENCOUNTER — Encounter (HOSPITAL_COMMUNITY)
Admission: AD | Disposition: A | Discharge status: Home / Self Care | Payer: Self-pay | Source: Ambulatory Visit | Attending: Obstetrics and Gynecology

## 2012-11-20 LAB — CBC
Hemoglobin: 10.7 g/dL — ABNORMAL LOW (ref 12.0–15.0)
Hemoglobin: 10.9 g/dL — ABNORMAL LOW (ref 12.0–15.0)
MCH: 27.6 pg (ref 26.0–34.0)
MCV: 83.8 fL (ref 78.0–100.0)
Platelets: 171 10*3/uL (ref 150–400)
RBC: 3.87 MIL/uL (ref 3.87–5.11)
RBC: 3.94 MIL/uL (ref 3.87–5.11)
WBC: 18.4 10*3/uL — ABNORMAL HIGH (ref 4.0–10.5)
WBC: 19.6 10*3/uL — ABNORMAL HIGH (ref 4.0–10.5)

## 2012-11-20 LAB — COMPREHENSIVE METABOLIC PANEL
AST: 26 U/L (ref 0–37)
Albumin: 2.1 g/dL — ABNORMAL LOW (ref 3.5–5.2)
Alkaline Phosphatase: 89 U/L (ref 39–117)
BUN: 11 mg/dL (ref 6–23)
Chloride: 99 mEq/L (ref 96–112)
Potassium: 5.2 mEq/L — ABNORMAL HIGH (ref 3.5–5.1)
Total Bilirubin: 0.3 mg/dL (ref 0.3–1.2)

## 2012-11-20 SURGERY — Surgical Case
Anesthesia: Epidural | Site: Abdomen | Wound class: Clean Contaminated

## 2012-11-20 MED ORDER — METOCLOPRAMIDE HCL 5 MG/ML IJ SOLN
10.0000 mg | Freq: Three times a day (TID) | INTRAMUSCULAR | Status: DC | PRN
  Administered 2012-11-20: 10 mg via INTRAVENOUS
  Filled 2012-11-20: qty 2

## 2012-11-20 MED ORDER — NALOXONE HCL 1 MG/ML IJ SOLN
1.0000 ug/kg/h | INTRAVENOUS | Status: DC | PRN
  Filled 2012-11-20: qty 2

## 2012-11-20 MED ORDER — MEPERIDINE HCL 25 MG/ML IJ SOLN: 6.2500 mg | INTRAMUSCULAR | Status: DC | PRN

## 2012-11-20 MED ORDER — PROMETHAZINE HCL 25 MG/ML IJ SOLN: 6.2500 mg | INTRAMUSCULAR | Status: DC | PRN

## 2012-11-20 MED ORDER — KETOROLAC TROMETHAMINE 30 MG/ML IJ SOLN
INTRAMUSCULAR | Status: AC
  Filled 2012-11-20: qty 1

## 2012-11-20 MED ORDER — KETOROLAC TROMETHAMINE 30 MG/ML IJ SOLN
30.0000 mg | Freq: Four times a day (QID) | INTRAMUSCULAR | Status: DC | PRN
  Administered 2012-11-20: 30 mg via INTRAVENOUS

## 2012-11-20 MED ORDER — ONDANSETRON HCL 4 MG/2ML IJ SOLN
INTRAMUSCULAR | Status: DC | PRN
  Administered 2012-11-20: 4 mg via INTRAVENOUS

## 2012-11-20 MED ORDER — DIBUCAINE 1 % RE OINT: 1.0000 "application " | TOPICAL_OINTMENT | RECTAL | Status: DC | PRN

## 2012-11-20 MED ORDER — WITCH HAZEL-GLYCERIN EX PADS: 1.0000 "application " | MEDICATED_PAD | CUTANEOUS | Status: DC | PRN

## 2012-11-20 MED ORDER — SCOPOLAMINE 1 MG/3DAYS TD PT72: 1.0000 | MEDICATED_PATCH | Freq: Once | TRANSDERMAL | Status: DC

## 2012-11-20 MED ORDER — LACTATED RINGERS IV BOLUS (SEPSIS)
500.0000 mL | Freq: Once | INTRAVENOUS | Status: DC
Start: 2012-11-20 — End: 2012-11-23

## 2012-11-20 MED ORDER — NALBUPHINE HCL 10 MG/ML IJ SOLN
5.0000 mg | INTRAMUSCULAR | Status: DC | PRN
  Filled 2012-11-20: qty 1

## 2012-11-20 MED ORDER — IBUPROFEN 600 MG PO TABS
600.0000 mg | ORAL_TABLET | Freq: Four times a day (QID) | ORAL | Status: DC
  Administered 2012-11-20: 600 mg via ORAL
  Filled 2012-11-20: qty 1

## 2012-11-20 MED ORDER — CEFAZOLIN SODIUM-DEXTROSE 2-3 GM-% IV SOLR
INTRAVENOUS | Status: AC
  Filled 2012-11-20: qty 50

## 2012-11-20 MED ORDER — LACTATED RINGERS IV SOLN: INTRAVENOUS | Status: DC

## 2012-11-20 MED ORDER — MIDAZOLAM HCL 2 MG/2ML IJ SOLN: 0.5000 mg | Freq: Once | INTRAMUSCULAR | Status: DC | PRN

## 2012-11-20 MED ORDER — KETOROLAC TROMETHAMINE 30 MG/ML IJ SOLN: 30.0000 mg | Freq: Four times a day (QID) | INTRAMUSCULAR | Status: DC | PRN

## 2012-11-20 MED ORDER — LIDOCAINE-EPINEPHRINE (PF) 2 %-1:200000 IJ SOLN
INTRAMUSCULAR | Status: AC
  Filled 2012-11-20: qty 20

## 2012-11-20 MED ORDER — ACETAMINOPHEN 10 MG/ML IV SOLN
1000.0000 mg | Freq: Four times a day (QID) | INTRAVENOUS | Status: AC | PRN
  Filled 2012-11-20: qty 100

## 2012-11-20 MED ORDER — ONDANSETRON HCL 4 MG/2ML IJ SOLN: 4.0000 mg | Freq: Three times a day (TID) | INTRAMUSCULAR | Status: DC | PRN

## 2012-11-20 MED ORDER — PRENATAL MULTIVITAMIN CH
1.0000 | ORAL_TABLET | Freq: Every day | ORAL | Status: DC
  Administered 2012-11-21: 1 via ORAL
  Filled 2012-11-20 (×4): qty 1

## 2012-11-20 MED ORDER — ONDANSETRON HCL 4 MG PO TABS: 4.0000 mg | ORAL_TABLET | ORAL | Status: DC | PRN

## 2012-11-20 MED ORDER — NALOXONE HCL 0.4 MG/ML IJ SOLN: 0.4000 mg | INTRAMUSCULAR | Status: DC | PRN

## 2012-11-20 MED ORDER — ONDANSETRON HCL 4 MG/2ML IJ SOLN
INTRAMUSCULAR | Status: AC
  Filled 2012-11-20: qty 2

## 2012-11-20 MED ORDER — SODIUM BICARBONATE 8.4 % IV SOLN
INTRAVENOUS | Status: DC | PRN
  Administered 2012-11-20: 3 mL via EPIDURAL

## 2012-11-20 MED ORDER — PRENATAL MULTIVITAMIN CH
1.0000 | ORAL_TABLET | Freq: Every day | ORAL | Status: DC
  Administered 2012-11-21 – 2012-11-23 (×3): 1 via ORAL
  Filled 2012-11-20: qty 1

## 2012-11-20 MED ORDER — MAGNESIUM SULFATE 40 G IN LACTATED RINGERS - SIMPLE
1.0000 g/h | INTRAVENOUS | Status: AC
  Filled 2012-11-20 (×2): qty 500

## 2012-11-20 MED ORDER — OXYTOCIN 40 UNITS IN LACTATED RINGERS INFUSION - SIMPLE MED: 62.5000 mL/h | INTRAVENOUS | Status: AC

## 2012-11-20 MED ORDER — CEFAZOLIN SODIUM-DEXTROSE 2-3 GM-% IV SOLR
2.0000 g | Freq: Once | INTRAVENOUS | Status: DC
  Administered 2012-11-20: 2 g via INTRAVENOUS

## 2012-11-20 MED ORDER — SCOPOLAMINE 1 MG/3DAYS TD PT72
MEDICATED_PATCH | TRANSDERMAL | Status: AC
  Administered 2012-11-20: 1.5 mg
  Filled 2012-11-20: qty 1

## 2012-11-20 MED ORDER — LABETALOL HCL 300 MG PO TABS
300.0000 mg | ORAL_TABLET | Freq: Two times a day (BID) | ORAL | Status: DC
  Administered 2012-11-20 – 2012-11-23 (×7): 300 mg via ORAL
  Filled 2012-11-20 (×9): qty 1

## 2012-11-20 MED ORDER — OXYTOCIN 10 UNIT/ML IJ SOLN
INTRAMUSCULAR | Status: AC
  Filled 2012-11-20: qty 4

## 2012-11-20 MED ORDER — MAGNESIUM SULFATE 40 G IN LACTATED RINGERS - SIMPLE
2.0000 g/h | INTRAVENOUS | Status: DC
  Administered 2012-11-20: 2 g/h via INTRAVENOUS
  Filled 2012-11-20: qty 500

## 2012-11-20 MED ORDER — DIPHENHYDRAMINE HCL 50 MG/ML IJ SOLN: 12.5000 mg | INTRAMUSCULAR | Status: DC | PRN

## 2012-11-20 MED ORDER — OXYTOCIN 10 UNIT/ML IJ SOLN
40.0000 [IU] | INTRAVENOUS | Status: DC | PRN
  Administered 2012-11-20: 40 [IU] via INTRAVENOUS

## 2012-11-20 MED ORDER — DIPHENHYDRAMINE HCL 25 MG PO CAPS: 25.0000 mg | ORAL_CAPSULE | ORAL | Status: DC | PRN

## 2012-11-20 MED ORDER — OXYCODONE-ACETAMINOPHEN 5-325 MG PO TABS: 1.0000 | ORAL_TABLET | ORAL | Status: DC | PRN

## 2012-11-20 MED ORDER — SODIUM BICARBONATE 8.4 % IV SOLN
INTRAVENOUS | Status: AC
  Filled 2012-11-20: qty 50

## 2012-11-20 MED ORDER — TETANUS-DIPHTH-ACELL PERTUSSIS 5-2.5-18.5 LF-MCG/0.5 IM SUSP
0.5000 mL | Freq: Once | INTRAMUSCULAR | Status: AC
  Administered 2012-11-21: 0.5 mL via INTRAMUSCULAR
  Filled 2012-11-20: qty 0.5

## 2012-11-20 MED ORDER — FENTANYL CITRATE 0.05 MG/ML IJ SOLN: 25.0000 ug | INTRAMUSCULAR | Status: DC | PRN

## 2012-11-20 MED ORDER — MORPHINE SULFATE 0.5 MG/ML IJ SOLN
INTRAMUSCULAR | Status: AC
  Filled 2012-11-20: qty 10

## 2012-11-20 MED ORDER — TERBUTALINE SULFATE 1 MG/ML IJ SOLN
0.2500 mg | Freq: Once | INTRAMUSCULAR | Status: AC
  Administered 2012-11-20: 0.25 mg via SUBCUTANEOUS

## 2012-11-20 MED ORDER — LACTATED RINGERS IV BOLUS (SEPSIS)
500.0000 mL | Freq: Once | INTRAVENOUS | Status: AC
  Administered 2012-11-20: 500 mL via INTRAVENOUS

## 2012-11-20 MED ORDER — DIPHENHYDRAMINE HCL 50 MG/ML IJ SOLN: 25.0000 mg | INTRAMUSCULAR | Status: DC | PRN

## 2012-11-20 MED ORDER — MEASLES, MUMPS & RUBELLA VAC ~~LOC~~ INJ
0.5000 mL | INJECTION | Freq: Once | SUBCUTANEOUS | Status: DC
  Filled 2012-11-20: qty 0.5

## 2012-11-20 MED ORDER — LACTATED RINGERS IV SOLN
INTRAVENOUS | Status: DC
  Administered 2012-11-20 (×2): via INTRAVENOUS

## 2012-11-20 MED ORDER — MORPHINE SULFATE (PF) 0.5 MG/ML IJ SOLN
INTRAMUSCULAR | Status: DC | PRN
  Administered 2012-11-20: 1 mg via INTRAVENOUS
  Administered 2012-11-20: 4 mg via EPIDURAL

## 2012-11-20 MED ORDER — SERTRALINE HCL 100 MG PO TABS
100.0000 mg | ORAL_TABLET | Freq: Every day | ORAL | Status: DC
  Administered 2012-11-20 – 2012-11-23 (×4): 100 mg via ORAL
  Filled 2012-11-20 (×5): qty 1

## 2012-11-20 MED ORDER — ZOLPIDEM TARTRATE 5 MG PO TABS: 5.0000 mg | ORAL_TABLET | Freq: Every evening | ORAL | Status: DC | PRN

## 2012-11-20 MED ORDER — SODIUM CHLORIDE 0.9 % IJ SOLN: 3.0000 mL | INTRAMUSCULAR | Status: DC | PRN

## 2012-11-20 MED ORDER — SODIUM CHLORIDE 0.9 % IV BOLUS (SEPSIS)
500.0000 mL | Freq: Once | INTRAVENOUS | Status: AC
  Administered 2012-11-20: 500 mL via INTRAVENOUS

## 2012-11-20 MED ORDER — ONDANSETRON HCL 4 MG/2ML IJ SOLN: 4.0000 mg | INTRAMUSCULAR | Status: DC | PRN

## 2012-11-20 SURGICAL SUPPLY — 33 items
CLOTH BEACON ORANGE TIMEOUT ST (SAFETY) ×2 IMPLANT
CONTAINER PREFILL 10% NBF 15ML (MISCELLANEOUS) IMPLANT
DRAPE LG THREE QUARTER DISP (DRAPES) ×2 IMPLANT
DRSG OPSITE POSTOP 4X10 (GAUZE/BANDAGES/DRESSINGS) ×2 IMPLANT
DRSG OPSITE POSTOP 4X12 (GAUZE/BANDAGES/DRESSINGS) ×1 IMPLANT
DURAPREP 26ML APPLICATOR (WOUND CARE) ×2 IMPLANT
ELECT REM PT RETURN 9FT ADLT (ELECTROSURGICAL) ×2
ELECTRODE REM PT RTRN 9FT ADLT (ELECTROSURGICAL) ×1 IMPLANT
EXTRACTOR VACUUM M CUP 4 TUBE (SUCTIONS) IMPLANT
GLOVE BIOGEL PI IND STRL 7.5 (GLOVE) IMPLANT
GLOVE BIOGEL PI INDICATOR 7.5 (GLOVE) ×1
GLOVE ECLIPSE 7.0 STRL STRAW (GLOVE) ×4 IMPLANT
GLOVE SURG SS PI 7.5 STRL IVOR (GLOVE) ×1 IMPLANT
GOWN PREVENTION PLUS XLARGE (GOWN DISPOSABLE) ×2 IMPLANT
GOWN STRL REIN XL XLG (GOWN DISPOSABLE) ×4 IMPLANT
KIT ABG SYR 3ML LUER SLIP (SYRINGE) IMPLANT
NDL HYPO 25X5/8 SAFETYGLIDE (NEEDLE) IMPLANT
NEEDLE HYPO 25X5/8 SAFETYGLIDE (NEEDLE) IMPLANT
NS IRRIG 1000ML POUR BTL (IV SOLUTION) ×2 IMPLANT
PACK C SECTION WH (CUSTOM PROCEDURE TRAY) ×2 IMPLANT
PAD OB MATERNITY 4.3X12.25 (PERSONAL CARE ITEMS) ×2 IMPLANT
RETRACTOR WND ALEXIS 25 LRG (MISCELLANEOUS) IMPLANT
RETRACTOR WOUND ALXS 34CM XLRG (MISCELLANEOUS) IMPLANT
RTRCTR WOUND ALEXIS 25CM LRG (MISCELLANEOUS) ×2
RTRCTR WOUND ALEXIS 34CM XLRG (MISCELLANEOUS)
STAPLER VISISTAT 35W (STAPLE) ×1 IMPLANT
SUT MNCRL 0 VIOLET CTX 36 (SUTURE) ×3 IMPLANT
SUT MON AB 2-0 CT1 27 (SUTURE) ×4 IMPLANT
SUT MONOCRYL 0 CTX 36 (SUTURE) ×3
SUT PLAIN 0 NONE (SUTURE) IMPLANT
TOWEL OR 17X24 6PK STRL BLUE (TOWEL DISPOSABLE) ×6 IMPLANT
TRAY FOLEY CATH 14FR (SET/KITS/TRAYS/PACK) IMPLANT
WATER STERILE IRR 1000ML POUR (IV SOLUTION) ×2 IMPLANT

## 2012-11-20 NOTE — Anesthesia Postprocedure Evaluation (Signed)
  Anesthesia Post Note  Patient: Grace Carter  Procedure(s) Performed: Procedure(s) (LRB): CESAREAN SECTION (N/A)  Anesthesia type: Epidural  Patient location: PACU  Post pain: Pain level controlled  Post assessment: Post-op Vital signs reviewed  Last Vitals:  Filed Vitals:   11/20/12 0308  BP: 158/89  Pulse: 92  Temp:   Resp:     Post vital signs: Reviewed  Level of consciousness: awake  Complications: No apparent anesthesia complications

## 2012-11-20 NOTE — Progress Notes (Signed)
Updated Dr Dareen Piano by phone. Informed of cervical change. Informed amnioinfusion infusing, fetal heart rate decelerations have improved but still present, variability still minimal, scalp stim  during exam. Informed him urine output still <30 mL/hr and requested order for Mag level. Lab order received. Dr stated to check cervix in an hour.

## 2012-11-20 NOTE — Progress Notes (Signed)
500cc LR bolus complete. Pt resting quietly.

## 2012-11-20 NOTE — Progress Notes (Signed)
Phone report to Dr. Fredia Sorrow re decreased UO (15ml past hr).  Will wait for MN labs and reevaluate.

## 2012-11-20 NOTE — Progress Notes (Signed)
Called to see pt regarding a change in FHTs. Pt had a protracted labor curve. She is now 9 cm and having variable/late decels. Also the BTB variability has decreased. Attempts to correct with position changes, amnioinfusion have been unsuccessful. The cx could not be reduced. The station is still -2. Discussed with pt and husband. Will proceed with C/S. Will also give SQ terb in preperation for the OR. Pit now off.

## 2012-11-20 NOTE — Transfer of Care (Signed)
Immediate Anesthesia Transfer of Care Note  Patient: Grace Carter  Procedure(s) Performed: Procedure(s): CESAREAN SECTION (N/A)  Patient Location: PACU  Anesthesia Type:Epidural  Level of Consciousness: awake, alert  and oriented  Airway & Oxygen Therapy: Patient Spontanous Breathing  Post-op Assessment: Report given to PACU RN and Post -op Vital signs reviewed and stable  Post vital signs: Reviewed and stable  Complications: No apparent anesthesia complications

## 2012-11-20 NOTE — Progress Notes (Signed)
Dr. Fredia Sorrow made aware of pt's continued low urine output. See new orders.

## 2012-11-20 NOTE — Anesthesia Postprocedure Evaluation (Signed)
  Anesthesia Post-op Note  Patient: Grace Carter  Procedure(s) Performed: Procedure(s): CESAREAN SECTION (N/A)  Patient Location: PACU  Anesthesia Type:Epidural  Level of Consciousness: awake, alert  and oriented  Airway and Oxygen Therapy: Patient Spontanous Breathing  Post-op Pain: mild  Post-op Assessment: Post-op Vital signs reviewed, Patient's Cardiovascular Status Stable, No headache, No backache, No residual numbness and No residual motor weakness  Post-op Vital Signs: Reviewed and stable  Complications: No apparent anesthesia complications

## 2012-11-20 NOTE — Progress Notes (Signed)
Notified by lab of magnesium level of 8.1 - will contact MD.

## 2012-11-20 NOTE — Progress Notes (Signed)
Subjective: Postpartum Day 0: Cesarean Delivery Patient reports pain controlled and pt in good spirits  Objective: Vital signs in last 24 hours: Temp:  [97 F (36.1 C)-98.6 F (37 C)] 98.6 F (37 C) (05/22 0716) Pulse Rate:  [43-94] 76 (05/22 1100) Resp:  [18-27] 20 (05/22 1100) BP: (115-182)/(45-123) 134/81 mmHg (05/22 1100) SpO2:  [77 %-99 %] 95 % (05/22 1100) Weight:  [122.244 kg (269 lb 8 oz)] 122.244 kg (269 lb 8 oz) (05/22 0640)  Physical Exam:  General: alert, cooperative and appears stated age Lochia: appropriate Uterine Fundus: firm Incision: healing well 2-3+ pitting edema LE   Recent Labs  11/19/12 1015 11/20/12 0439  HGB 12.1 10.7*  HCT 35.4* 32.1*    Assessment/Plan: 1) Status post Cesarean section. Doing well postoperatively. Currently On magnesuim.  UOP low at 30 cc/hr for last 2 hrs.  IVF just changed to LR from pitocin.  If UOP doesn't improve will bolus.   Baby doing well, having some temperature regulation and low BS issues but otherwise doing well. 2) Continue Mag Sulfate for 24 hours after delivery.  Will check Mg level @ 5pm Continue current care.  Sky Primo H. 11/20/2012, 11:41 AM

## 2012-11-21 ENCOUNTER — Encounter (HOSPITAL_COMMUNITY): Payer: Self-pay | Admitting: Obstetrics and Gynecology

## 2012-11-21 LAB — COMPREHENSIVE METABOLIC PANEL
ALT: 11 U/L (ref 0–35)
AST: 20 U/L (ref 0–37)
Albumin: 1.7 g/dL — ABNORMAL LOW (ref 3.5–5.2)
Alkaline Phosphatase: 62 U/L (ref 39–117)
Alkaline Phosphatase: 68 U/L (ref 39–117)
BUN: 13 mg/dL (ref 6–23)
CO2: 24 mEq/L (ref 19–32)
Calcium: 6.6 mg/dL — ABNORMAL LOW (ref 8.4–10.5)
GFR calc Af Amer: 63 mL/min — ABNORMAL LOW (ref >90)
GFR calc non Af Amer: 54 mL/min — ABNORMAL LOW (ref >90)
Glucose, Bld: 97 mg/dL (ref 70–99)
Potassium: 4.3 mEq/L (ref 3.5–5.1)
Potassium: 4.4 mEq/L (ref 3.5–5.1)
Sodium: 132 mEq/L — ABNORMAL LOW (ref 135–145)
Total Bilirubin: 0.2 mg/dL — ABNORMAL LOW (ref 0.3–1.2)
Total Protein: 4.7 g/dL — ABNORMAL LOW (ref 6.0–8.3)
Total Protein: 5 g/dL — ABNORMAL LOW (ref 6.0–8.3)

## 2012-11-21 LAB — CBC
HCT: 30.4 % — ABNORMAL LOW (ref 36.0–46.0)
Hemoglobin: 10.1 g/dL — ABNORMAL LOW (ref 12.0–15.0)
Hemoglobin: 9.4 g/dL — ABNORMAL LOW (ref 12.0–15.0)
MCHC: 32.8 g/dL (ref 30.0–36.0)
MCHC: 33.2 g/dL (ref 30.0–36.0)
Platelets: 159 10*3/uL (ref 150–400)
RDW: 13.7 % (ref 11.5–15.5)

## 2012-11-21 LAB — MAGNESIUM: Magnesium: 7.1 mg/dL (ref 1.5–2.5)

## 2012-11-21 MED ORDER — SODIUM CHLORIDE 0.9 % IV SOLN
Freq: Once | INTRAVENOUS | Status: AC
  Administered 2012-11-21: 01:00:00 via INTRAVENOUS

## 2012-11-21 MED ORDER — IBUPROFEN 800 MG PO TABS: 800.0000 mg | ORAL_TABLET | Freq: Three times a day (TID) | ORAL | Status: DC | PRN

## 2012-11-21 MED ORDER — IBUPROFEN 800 MG PO TABS
800.0000 mg | ORAL_TABLET | Freq: Three times a day (TID) | ORAL | Status: DC | PRN
  Administered 2012-11-21 – 2012-11-23 (×5): 800 mg via ORAL
  Filled 2012-11-21 (×6): qty 1

## 2012-11-21 NOTE — Progress Notes (Addendum)
Patient ID: CINDIE RAJAGOPALAN, female   DOB: September 22, 1970, 42 y.o.   MRN: [id number]  The patient's urine output has been borderline to insufficient throughout the day today. She has received three 500 mL boluses. The first was with lactating ringers the last two was normal saline. On the last two blood draws her creatinine has been noted to be elevated at 1.2.  I believe this to be prerenal due to third spacing of fluid secondary to the physiology of preeclampsia.  When the patient has received fluid boluses her urine output has responded by staying at approximately 30 mL per hour.  For potassium was 5.2 but is now 4.4 of the most recent blood draw. She remains slightly hyponatremia with the sodium 130. On her initial blood draw her magnesium level was 8.1.  This has improved to 7.1 since the magnesium was turned down to 1 g per hour.  Will plan to discontinue the magnesium 24 hours after delivery.  Will recheck bloodwork in the morning.  For the patient's nurse  The hemoconcentrated  Appearance of the patient's urine does seem to be approving.  Patient continues to have significance and impressive clonus despite her therapeutic mag level.  Her blood pressures are improved.  Will need to see if this continues with anything that is discontinued.  Addendum RN flushed foley catheter multiple times with minimal return of urine.  Finally removed catheter and found small clot in tip. When replaced catheter got 300 cc urine!

## 2012-11-21 NOTE — Progress Notes (Signed)
UR chart review completed.  

## 2012-11-21 NOTE — Progress Notes (Signed)
Foley catheter flushed easily but still minimal output noted.  Foley removed and found to have clot occluding/impeding flow.  @nd  catheter placed without difficulty and immediate urine return noted.  Urine tea colored but yellow, clearing in tube.

## 2012-11-21 NOTE — Progress Notes (Addendum)
Patient is eating, ambulating, foley in.  Pain control is good.  Filed Vitals:   11/21/12 0540 11/21/12 0600 11/21/12 0643 11/21/12 0729  BP:  146/86  148/82  Pulse: 86 81 81 91  Temp:    97.9 F (36.6 C)  TempSrc:    Oral  Resp: 18   18  Height:      Weight:   123.333 kg (271 lb 14.4 oz)   SpO2: 97% 98% 99% 96%    lungs:   clear to auscultation cor:    RRR Abdomen:  soft, appropriate tenderness, incisions intact and without erythema or exudate ex:    no cords   Lab Results  Component Value Date   WBC 14.4* 11/21/2012   HGB 9.4* 11/21/2012   HCT 28.7* 11/21/2012   MCV 84.2 11/21/2012   PLT 159 11/21/2012   24 hour I 11,101/O 5438 Last 7 hours UOP 1000cc out   Results for orders placed during the hospital encounter of 11/18/12 (from the past 24 hour(s))  CBC     Status: Abnormal   Collection Time    11/20/12  5:40 PM      Result Value Range   WBC 19.6 (*) 4.0 - 10.5 K/uL   RBC 3.94  3.87 - 5.11 MIL/uL   Hemoglobin 10.9 (*) 12.0 - 15.0 g/dL   HCT 21.3 (*) 08.6 - 57.8 %   MCV 83.8  78.0 - 100.0 fL   MCH 27.7  26.0 - 34.0 pg   MCHC 33.0  30.0 - 36.0 g/dL   RDW 46.9  62.9 - 52.8 %   Platelets 171  150 - 400 K/uL  MAGNESIUM     Status: Abnormal   Collection Time    11/20/12  5:40 PM      Result Value Range   Magnesium 8.1 (*) 1.5 - 2.5 mg/dL  COMPREHENSIVE METABOLIC PANEL     Status: Abnormal   Collection Time    11/20/12  5:40 PM      Result Value Range   Sodium 131 (*) 135 - 145 mEq/L   Potassium 5.2 (*) 3.5 - 5.1 mEq/L   Chloride 99  96 - 112 mEq/L   CO2 23  19 - 32 mEq/L   Glucose, Bld 100 (*) 70 - 99 mg/dL   BUN 11  6 - 23 mg/dL   Creatinine, Ser 4.13 (*) 0.50 - 1.10 mg/dL   Calcium 7.4 (*) 8.4 - 10.5 mg/dL   Total Protein 5.5 (*) 6.0 - 8.3 g/dL   Albumin 2.1 (*) 3.5 - 5.2 g/dL   AST 26  0 - 37 U/L   ALT 15  0 - 35 U/L   Alkaline Phosphatase 89  39 - 117 U/L   Total Bilirubin 0.3  0.3 - 1.2 mg/dL   GFR calc non Af Amer 54 (*) >90 mL/min   GFR calc Af  Amer 63 (*) >90 mL/min  CBC     Status: Abnormal   Collection Time    11/21/12 12:01 AM      Result Value Range   WBC 16.4 (*) 4.0 - 10.5 K/uL   RBC 3.63 (*) 3.87 - 5.11 MIL/uL   Hemoglobin 10.1 (*) 12.0 - 15.0 g/dL   HCT 24.4 (*) 01.0 - 27.2 %   MCV 83.7  78.0 - 100.0 fL   MCH 27.8  26.0 - 34.0 pg   MCHC 33.2  30.0 - 36.0 g/dL   RDW 53.6  64.4 - 03.4 %  Platelets 170  150 - 400 K/uL  COMPREHENSIVE METABOLIC PANEL     Status: Abnormal   Collection Time    11/21/12 12:01 AM      Result Value Range   Sodium 130 (*) 135 - 145 mEq/L   Potassium 4.4  3.5 - 5.1 mEq/L   Chloride 99  96 - 112 mEq/L   CO2 24  19 - 32 mEq/L   Glucose, Bld 97  70 - 99 mg/dL   BUN 13  6 - 23 mg/dL   Creatinine, Ser 1.61 (*) 0.50 - 1.10 mg/dL   Calcium 6.8 (*) 8.4 - 10.5 mg/dL   Total Protein 5.0 (*) 6.0 - 8.3 g/dL   Albumin 1.9 (*) 3.5 - 5.2 g/dL   AST 21  0 - 37 U/L   ALT 13  0 - 35 U/L   Alkaline Phosphatase 68  39 - 117 U/L   Total Bilirubin 0.2 (*) 0.3 - 1.2 mg/dL   GFR calc non Af Amer 54 (*) >90 mL/min   GFR calc Af Amer 63 (*) >90 mL/min  MAGNESIUM     Status: Abnormal   Collection Time    11/21/12 12:01 AM      Result Value Range   Magnesium 7.1 (*) 1.5 - 2.5 mg/dL  CBC     Status: Abnormal   Collection Time    11/21/12  5:30 AM      Result Value Range   WBC 14.4 (*) 4.0 - 10.5 K/uL   RBC 3.41 (*) 3.87 - 5.11 MIL/uL   Hemoglobin 9.4 (*) 12.0 - 15.0 g/dL   HCT 09.6 (*) 04.5 - 40.9 %   MCV 84.2  78.0 - 100.0 fL   MCH 27.6  26.0 - 34.0 pg   MCHC 32.8  30.0 - 36.0 g/dL   RDW 81.1  91.4 - 78.2 %   Platelets 159  150 - 400 K/uL  COMPREHENSIVE METABOLIC PANEL     Status: Abnormal   Collection Time    11/21/12  5:30 AM      Result Value Range   Sodium 132 (*) 135 - 145 mEq/L   Potassium 4.3  3.5 - 5.1 mEq/L   Chloride 102  96 - 112 mEq/L   CO2 24  19 - 32 mEq/L   Glucose, Bld 85  70 - 99 mg/dL   BUN 12  6 - 23 mg/dL   Creatinine, Ser 9.56 (*) 0.50 - 1.10 mg/dL   Calcium 6.6 (*) 8.4  - 10.5 mg/dL   Total Protein 4.7 (*) 6.0 - 8.3 g/dL   Albumin 1.7 (*) 3.5 - 5.2 g/dL   AST 20  0 - 37 U/L   ALT 11  0 - 35 U/L   Alkaline Phosphatase 62  39 - 117 U/L   Total Bilirubin 0.2 (*) 0.3 - 1.2 mg/dL   GFR calc non Af Amer 56 (*) >90 mL/min   GFR calc Af Amer 65 (*) >90 mL/min  MAGNESIUM     Status: Abnormal   Collection Time    11/21/12  5:30 AM      Result Value Range   Magnesium 6.1 (*) 1.5 - 2.5 mg/dL    --/--/A POS (21/30 8657)/QI  A/P    Post operative day 1.5.  Routine post op and postpartum care.  Expect d/c in 1-2 days if UOP picks up. Magnesium stopped now that she is diuresing.  BPs stable and labs are normal EXCEPT for elevated Creatinine at  1.18.  Repeat CMET tomorrow.  Percocet for pain control.   Addended note after talking to nurse- UOP was not in computer.

## 2012-11-22 LAB — COMPREHENSIVE METABOLIC PANEL
Alkaline Phosphatase: 68 U/L (ref 39–117)
BUN: 10 mg/dL (ref 6–23)
CO2: 24 mEq/L (ref 19–32)
Chloride: 104 mEq/L (ref 96–112)
Creatinine, Ser: 0.93 mg/dL (ref 0.50–1.10)
GFR calc non Af Amer: 75 mL/min — ABNORMAL LOW (ref >90)
Potassium: 4.1 mEq/L (ref 3.5–5.1)
Total Bilirubin: 0.4 mg/dL (ref 0.3–1.2)

## 2012-11-22 MED ORDER — NIFEDIPINE ER 30 MG PO TB24
30.0000 mg | ORAL_TABLET | Freq: Two times a day (BID) | ORAL | Status: DC
Start: 2012-11-22 — End: 2012-11-23
  Administered 2012-11-22 – 2012-11-23 (×2): 30 mg via ORAL
  Filled 2012-11-22 (×4): qty 1

## 2012-11-22 MED ORDER — NIFEDIPINE ER 30 MG PO TB24
30.0000 mg | ORAL_TABLET | Freq: Every day | ORAL | Status: DC
  Administered 2012-11-22: 30 mg via ORAL
  Filled 2012-11-22 (×2): qty 1

## 2012-11-22 NOTE — Progress Notes (Signed)
Notified Dr Henderson Cloud about patients blood pressures, new order received.

## 2012-11-22 NOTE — Progress Notes (Signed)
  Patient is eating, ambulating, voiding.  Pain control is good.  Filed Vitals:   11/21/12 2131 11/22/12 0238 11/22/12 0635 11/22/12 0802  BP: 175/99 155/98 165/99 165/96  Pulse: 87 80 81 80  Temp: 98.4 F (36.9 C) 98 F (36.7 C) 97.5 F (36.4 C) 97.8 F (36.6 C)  TempSrc: Oral Oral Oral Oral  Resp: 18 18 18 18   Height:      Weight:      SpO2:   98%     lungs:   clear to auscultation cor:    RRR Abdomen:  soft, appropriate tenderness, incisions intact and without erythema or exudate ex:    no cords   Results for orders placed during the hospital encounter of 11/18/12 (from the past 24 hour(s))  COMPREHENSIVE METABOLIC PANEL     Status: Abnormal   Collection Time    11/22/12  7:10 AM      Result Value Range   Sodium 138  135 - 145 mEq/L   Potassium 4.1  3.5 - 5.1 mEq/L   Chloride 104  96 - 112 mEq/L   CO2 24  19 - 32 mEq/L   Glucose, Bld 85  70 - 99 mg/dL   BUN 10  6 - 23 mg/dL   Creatinine, Ser 8.11  0.50 - 1.10 mg/dL   Calcium 7.4 (*) 8.4 - 10.5 mg/dL   Total Protein 5.0 (*) 6.0 - 8.3 g/dL   Albumin 2.1 (*) 3.5 - 5.2 g/dL   AST 26  0 - 37 U/L   ALT 13  0 - 35 U/L   Alkaline Phosphatase 68  39 - 117 U/L   Total Bilirubin 0.4  0.3 - 1.2 mg/dL   GFR calc non Af Amer 75 (*) >90 mL/min   GFR calc Af Amer 87 (*) >90 mL/min   CBC    Component Value Date/Time   WBC 14.4* 11/21/2012 0530   RBC 3.41* 11/21/2012 0530   HGB 9.4* 11/21/2012 0530   HCT 28.7* 11/21/2012 0530   PLT 159 11/21/2012 0530   MCV 84.2 11/21/2012 0530   MCH 27.6 11/21/2012 0530   MCHC 32.8 11/21/2012 0530   RDW 13.7 11/21/2012 0530      --/--/A POS (05/20 2050)/RI  A/P    Post operative day 3.  Routine post op and postpartum care.  Expect d/c tomorrow- need better BP control.  On labetalol- start procardia.  Bun/Cr are normalizing- all other labs normal except for slight anemia.  Percocet for pain control.

## 2012-11-23 MED ORDER — NIFEDIPINE ER 30 MG PO TB24: 30.0000 mg | ORAL_TABLET | Freq: Two times a day (BID) | ORAL | Status: AC

## 2012-11-23 MED ORDER — OXYCODONE-ACETAMINOPHEN 5-325 MG PO TABS: 1.0000 | ORAL_TABLET | ORAL | Status: AC | PRN

## 2012-11-23 MED ORDER — LABETALOL HCL 100 MG PO TABS: 300.0000 mg | ORAL_TABLET | Freq: Two times a day (BID) | ORAL | Status: AC

## 2012-11-23 NOTE — Discharge Summary (Signed)
Obstetric Discharge Summary Reason for Admission: induction of labor Prenatal Procedures: NST and Preeclampsia Intrapartum Procedures: cesarean: low cervical, transverse Postpartum Procedures: Magnesium sulfate Complications-Operative and Postpartum: none Hemoglobin  Date Value Range Status  11/21/2012 9.4* 12.0 - 15.0 g/dL Final     HCT  Date Value Range Status  11/21/2012 28.7* 36.0 - 46.0 % Final     Discharge Diagnoses: Term Pregnancy-delivered and Preelampsia  Discharge Information: Date: 11/23/2012 Activity: pelvic rest Diet: routine Medications: Percocet and procardia xl 30 bid and labetalol 300 bid Condition: stable Instructions: refer to practice specific booklet Discharge to: home Follow-up Information   Follow up with [name], MD In 2 weeks. (BP check)    Contact information:   [address] [name] [address] [phone]       Newborn Data: Live born female  Birth Weight: 5 lb 9 oz (2523 g) APGAR: 6, 8  Home with mother.  [name] 11/23/2012, 9:37 AM

## 2012-11-23 NOTE — Progress Notes (Signed)
  Patient is eating, ambulating, voiding.  Pain control is good.  Filed Vitals:   11/22/12 1540 11/22/12 1740 11/22/12 2243 11/23/12 0608  BP: 156/92 135/82 149/89 144/73  Pulse: 72 75 68 78  Temp:  98.8 F (37.1 C) 98 F (36.7 C) 98.1 F (36.7 C)  TempSrc:  Oral Oral Oral  Resp: 20 20 22 18   Height:      Weight:      SpO2:    99%    lungs:   clear to auscultation cor:    RRR Abdomen:  soft, appropriate tenderness, incisions intact and without erythema or exudate ex:    no cords   Lab Results  Component Value Date   WBC 14.4* 11/21/2012   HGB 9.4* 11/21/2012   HCT 28.7* 11/21/2012   MCV 84.2 11/21/2012   PLT 159 11/21/2012    --/--/A POS (05/20 2050)/RI  A/P    Post operative day 4.  Routine post op and postpartum care.  Expect d/c today as BPs are much better controlled on Procardia and Labetalol.  Percocet for pain control.

## 2012-11-26 NOTE — Op Note (Signed)
Grace Carter, Grace Carter               ACCOUNT NO.:  [id number]  MEDICAL RECORD NO.:  [id number]  LOCATION:  9141                          FACILITY:  [facility]  PHYSICIAN:  Malva Limes, M.D.    DATE OF BIRTH:  18-Dec-1970  DATE OF PROCEDURE: DATE OF DISCHARGE:  11/23/2012                              OPERATIVE REPORT   PREOPERATIVE DIAGNOSES: 1. Intrauterine pregnancy at term. 2. Failure to progress.  POSTOPERATIVE DIAGNOSES: 1. Intrauterine pregnancy at term. 2. Failure to progress.  PROCEDURE:  Primary low-transverse cesarean section.  SURGEON:  Malva Limes, M.D.  ANESTHESIA:  Epidural.  ANTIBIOTICS:  Ancef 2 g.  DRAINS:  Foley bedside drainage.  ESTIMATED BLOOD LOSS:  900 mL.  SPECIMENS:  None.  PROCEDURE:  The patient was taken to the operating room, where her epidural anesthetic was reinjected.  Once an adequate level was reached, the patient was placed in a dorsal supine position with a left lateral tilt.  She was prepped and draped in usual fashion for this procedure. A Pfannenstiel incision was made 2 cm above the pubic symphysis.  On entering the abdominal cavity, the bladder flap was taken down with sharp dissection.  A low-transverse uterine incision was made in the midline with the Metzenbaum scissors and extended laterally with blunt dissection.  Amniotic fluid was noted to be clear.  The infant was delivered in a vertex presentation.  On delivery of the head, the oropharynx and nostrils were bulb suctioned.  The remaining infant was delivered.  The cord was doubly clamped and cut and the infant handed to awaiting NICU team.  Placenta was then manually removed.  The uterus exteriorized and examined.  The uterine cavity was wiped with a wet lap. The uterine incision was closed in a single layer of 0-Monocryl suture in a running, locking fashion.  The bladder flap was closed using 2-0 Monocryl suture in a running fashion.  The uterus was placed back in  the abdominal cavity.  Hemostasis was checked and found to be adequate.  The rectus muscles and parietal peritoneum reapproximated in the midline using 2-0 Monocryl suture in a running fashion.  The fascia was closed using 0-Monocryl suture in a running fashion. Subcuticular tissue was made hemostatic with a Bovie and closed with interrupted 2-0 plain gut suture.  The incision was closed with stainless steel clips.  The patient tolerated the procedure well.  She was taken to the recovery room in stable condition.  Instrument and lap count was correct x2.          ______________________________ Malva Limes, M.D.     MA/MEDQ  D:  11/25/2012  T:  11/26/2012  Job:  [id number]

## 2014-05-03 ENCOUNTER — Encounter (HOSPITAL_COMMUNITY): Payer: Self-pay | Admitting: Obstetrics and Gynecology

## 2017-04-10 ENCOUNTER — Encounter (INDEPENDENT_AMBULATORY_CARE_PROVIDER_SITE_OTHER): Payer: Self-pay | Admitting: Orthopaedic Surgery

## 2017-04-10 ENCOUNTER — Ambulatory Visit (INDEPENDENT_AMBULATORY_CARE_PROVIDER_SITE_OTHER): Payer: BC Managed Care – PPO

## 2017-04-10 ENCOUNTER — Ambulatory Visit (INDEPENDENT_AMBULATORY_CARE_PROVIDER_SITE_OTHER): Payer: BC Managed Care – PPO | Admitting: Physician Assistant

## 2017-04-10 DIAGNOSIS — M79645 Pain in left finger(s): Secondary | ICD-10-CM | POA: Diagnosis not present

## 2017-04-10 NOTE — Progress Notes (Signed)
Office Visit Note   Patient: Grace Carter           Date of Birth: 07/19/1970           MRN: [id number] Visit Date: 04/10/2017              Requested by: No referring provider defined for this encounter. PCP: No primary care provider on file.   Assessment & Plan: Visit Diagnoses:  1. Pain of left middle finger     Plan: Grace Carter'll try PENNSAID samples were given. This is beneficial Grace Carter will call our office can call in a prescription for this. Also discussed the use of paraffin wax. Grace Carter'll return if her pain continues or becomes worse. Questions encouraged and answered length.   Follow-Up Instructions: Return if symptoms worsen or fail to improve.   Orders:  Orders Placed This Encounter  Procedures  . XR Finger Middle Left   No orders of the defined types were placed in this encounter.     Procedures: No procedures performed   Clinical Data: No additional findings.   Subjective: Chief Complaint  Patient presents with  . Left Middle Finger - Pain    HPI Grace Carter Is a 46 year old female comes in today with left middle finger pain. Pain is increased over the last couple weeks. However today her pain is better. Grace Carter's had no new injury to the finger. Grace Carter does report that a year and a half ago Grace Carter smashed the finger in a car door. Grace Carter is tried some ibuprofen with some minimal relief. Grace Carter states Grace Carter can hardly make a fist first thing in the morning due to the pain in the hand. He is keeping her awake. Grace Carter is right-hand-dominant. 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   Review of Systems Denies fevers, chills, numbness or tingling of the left hand.   Objective: Vital Signs: There were no vitals taken for this visit.  Physical Exam  Constitutional: Grace Carter is oriented to person, place, and time. Grace Carter appears well-developed and well-nourished. No distress.  Cardiovascular: Intact distal pulses.     Neurological: Grace Carter is alert and oriented to person, place, and time.  Skin: Grace Carter is not diaphoretic.  Psychiatric: Grace Carter has a normal mood and affect. Her behavior is normal.    Ortho Exam  bilateral hands no rashes skin lesions ulcerations. Slight edema of the left middle finger compared to the right. Grace Carter has tenderness over the dorsal aspect of the long finger PIP joint and middle phalanx. Into the hand is nontender. Grace Carter is able to make a fist fully extend the fingers. Grace Carter has no instability with stressing of the left third finger. Grace Carter has good strength of the third finger with flexion against resistance at the DIP joint and PIP joint at the metacarpal phalangeal joint  Specialty Comments:  No specialty comments available.  Imaging: Xr Finger Middle Left  Result Date: 04/10/2017 Left hand 3 views: No acute fracture no bony abnormalities. The PIP joint is well maintained. No dislocation or subluxation of third finger    PMFS History: Patient Active Problem List   Diagnosis Date Noted  . Family history of mental retardation 05/02/2012  . Elderly primigravida, antepartum 05/02/2012   Past Medical History:  Diagnosis Date  . Anxiety    Zoloft daily  . Pneumonia 2009   Treated   . Pregnancy induced hypertension   . 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    Family History  Problem Relation Age of Onset  . Hypertension Father     Past Surgical History:  Procedure Laterality Date  . CESAREAN SECTION N/A 11/20/2012   Procedure: CESAREAN SECTION;  Surgeon: Levi Aland, MD;  Location: [facility];  Service: Obstetrics;  Laterality: N/A;  . DILATION AND CURETTAGE OF UTERUS  2004   post miscarriage   Social History   Occupational History  . Not on file.   Social History Main Topics  . Smoking status: Never Smoker  . Smokeless tobacco: Never Used  . Alcohol use No  . Drug use: No  . Sexual activity: Not Currently

## 2018-02-12 ENCOUNTER — Other Ambulatory Visit: Payer: Self-pay

## 2018-02-12 ENCOUNTER — Emergency Department
Admission: EM | Admit: 2018-02-12 | Discharge: 2018-02-12 | Disposition: A | Discharge status: Home / Self Care | Payer: BC Managed Care – PPO | Source: Home / Self Care | Attending: Family Medicine | Admitting: Family Medicine

## 2018-02-12 ENCOUNTER — Emergency Department (INDEPENDENT_AMBULATORY_CARE_PROVIDER_SITE_OTHER): Payer: BC Managed Care – PPO

## 2018-02-12 DIAGNOSIS — M79672 Pain in left foot: Secondary | ICD-10-CM

## 2018-02-12 DIAGNOSIS — M7989 Other specified soft tissue disorders: Secondary | ICD-10-CM

## 2018-02-12 NOTE — ED Provider Notes (Signed)
[facility]    CSN: [id number] Arrival date & time: 02/12/18  1856     History   Chief Complaint Chief Complaint  Patient presents with  . Foot Pain    HPI [name] is a 47 y.o. female.   HPI  Grace Carter is a 47 y.o. female presenting to UC with c/o Left foot pain for about 1 week. She initially rolled her foot after stepping on her daughter's toy, then she hit it on the bottom of a pool a few days later. Pain is aching and sore, mild to moderate in severity with some swelling. No prior fracture to same foot.    Past Medical History:  Diagnosis Date  . Anxiety    Zoloft daily  . Pneumonia 2009   Treated   . Pregnancy induced hypertension   . Seizures (HCC) 1973   reaction to Penicillin at [age] years of age.    Patient Active Problem List   Diagnosis Date Noted  . Family history of mental retardation 05/02/2012  . Elderly primigravida, antepartum 05/02/2012    Past Surgical History:  Procedure Laterality Date  . CESAREAN SECTION N/A 11/20/2012   Procedure: CESAREAN SECTION;  Surgeon: Levi Aland, MD;  Location: [facility];  Service: Obstetrics;  Laterality: N/A;  . DILATION AND CURETTAGE OF UTERUS  2004   post miscarriage    OB History    Gravida  2   Para  1   Term  0   Preterm  1   AB  1   Living  1     SAB  1   TAB  0   Ectopic  0   Multiple  0   Live Births  1            Home Medications    Prior to Admission medications   Medication Sig Start Date End Date Taking? Authorizing Provider  acetaminophen (TYLENOL) 325 MG tablet Take 650 mg by mouth every 6 (six) hours as needed for pain.    [provider]  labetalol (NORMODYNE) 100 MG tablet Take 3 tablets (300 mg total) by mouth 2 (two) times daily. Patient not taking: Reported on 04/10/2017 11/23/12   Carrington Clamp, MD  labetalol (NORMODYNE) 300 MG tablet Take 1 tablet (300 mg total) by mouth 2 (two) times daily. Patient not taking: Reported on  04/10/2017 11/08/12   Ilda Mori, MD  NIFEdipine (PROCARDIA-XL/ADALAT CC) 30 MG 24 hr tablet Take 1 tablet (30 mg total) by mouth 2 (two) times daily. Patient not taking: Reported on 04/10/2017 11/23/12   Carrington Clamp, MD  omeprazole (PRILOSEC) 40 MG capsule Take 40 mg by mouth daily. 04/04/17   [provider]  oxyCODONE-acetaminophen (PERCOCET/ROXICET) 5-325 MG per tablet Take 1-2 tablets by mouth every 4 (four) hours as needed. Patient not taking: Reported on 04/10/2017 11/23/12   Carrington Clamp, MD  Prenatal Vit-Fe Fumarate-FA (PRENATAL MULTIVITAMIN) TABS Take 1 tablet by mouth at bedtime.     [provider]  ranitidine (ZANTAC) 150 MG tablet Take 150 mg by mouth 2 (two) times daily as needed for heartburn.     [provider]  sertraline (ZOLOFT) 100 MG tablet Take 100 mg by mouth every morning.     [provider]    Family History Family History  Problem Relation Age of Onset  . Hypertension Father     Social History Social History   Tobacco Use  . Smoking status: Never Smoker  .  Smokeless tobacco: Never Used  Substance Use Topics  . Alcohol use: No  . Drug use: No     Allergies   Penicillins; Ceclor [cefaclor]; and Other   Review of Systems Review of Systems  Musculoskeletal: Positive for arthralgias, joint swelling and myalgias.  Skin: Negative for color change and wound.  Neurological: Negative for weakness and numbness.     Physical Exam Triage Vital Signs ED Triage Vitals  Enc Vitals Group     BP 02/12/18 1917 130/88     Pulse Rate 02/12/18 1917 (!) 103     Resp --      Temp 02/12/18 1917 98.3 F (36.8 C)     Temp Source 02/12/18 1917 Oral     SpO2 02/12/18 1917 98 %     Weight 02/12/18 1918 240 lb (108.9 kg)     Height 02/12/18 1918 5\' 6"  (1.676 m)     Head Circumference --      Peak Flow --      Pain Score 02/12/18 1918 5     Pain Loc --      Pain Edu? --      Excl. in GC? --    No data  found.  Updated Vital Signs BP 130/88 (BP Location: Right Arm)   Pulse (!) 103   Temp 98.3 F (36.8 C) (Oral)   Ht 5\' 6"  (1.676 m)   Wt 240 lb (108.9 kg)   LMP 01/30/2018   SpO2 98%   BMI 38.74 kg/m   Visual Acuity Right Eye Distance:   Left Eye Distance:   Bilateral Distance:    Right Eye Near:   Left Eye Near:    Bilateral Near:     Physical Exam  Constitutional: She is oriented to person, place, and time. She appears well-developed and well-nourished.  HENT:  Head: Normocephalic and atraumatic.  Eyes: EOM are normal.  Neck: Normal range of motion.  Cardiovascular: Normal rate.  Pulmonary/Chest: Effort normal.  Musculoskeletal: Normal range of motion. She exhibits edema and tenderness.  Left foot: mild edema. Tenderness to dorsal lateral aspect. Full ROM ankle and toes.   Neurological: She is alert and oriented to person, place, and time.  Skin: Skin is warm and dry. Capillary refill takes less than 2 seconds.  Left foot: skin in tact. No ecchymosis or erythema.  Psychiatric: She has a normal mood and affect. Her behavior is normal.  Nursing note and vitals reviewed.    UC Treatments / Results  Labs (all labs ordered are listed, but only abnormal results are displayed) Labs Reviewed - No data to display  EKG None  Radiology Dg Foot Complete Left  Result Date: 02/12/2018 CLINICAL DATA:  Left foot pain and swelling EXAM: LEFT FOOT - COMPLETE 3+ VIEW COMPARISON:  None. FINDINGS: There is no evidence of fracture or dislocation. Dorsal soft tissue swelling noted. Small plantar and posterior calcaneal heel spurs noted. IMPRESSION: 1. No acute bone abnormality. 2. Dorsal soft tissue swelling noted. Electronically Signed   By: Signa Kellaylor  Stroud M.D.   On: 02/12/2018 19:36    Procedures Procedures (including critical care time)  Medications Ordered in UC Medications - No data to display  Initial Impression / Assessment and Plan / UC Course  I have reviewed the  triage vital signs and the nursing notes.  Pertinent labs & imaging results that were available during my care of the patient were reviewed by me and considered in my medical decision making (see chart for details).  Reassured pt no fracture or dislocation Will tx as mild sprain Pt declined ace wrap or crutches.  Final Clinical Impressions(s) / UC Diagnoses   Final diagnoses:  Foot pain, left  Swelling of left foot     Discharge Instructions      You may take 500mg  acetaminophen every 4-6 hours or in combination with ibuprofen 400-600mg  every 6-8 hours as needed for pain and inflammation.  You may try wrapping your foot in an ace wrap or wearing a more supportive shoe such as a tennis shoe for the next 1-2 weeks.  If you are still having pain after that, you may call to schedule a follow up appointment with Sports Medicine.      ED Prescriptions    None     Controlled Substance Prescriptions [place] Controlled Substance Registry consulted? Not Applicable   Rolla Platehelps, Yaslyn Cumby O, PA-C 02/13/18 1609

## 2018-02-12 NOTE — ED Triage Notes (Signed)
Pt has been having foot pain for about a week now.  She rolled her foot late last week, and hit it on the bottom of the pool.

## 2018-02-12 NOTE — Discharge Instructions (Signed)
°  You may take 500mg  acetaminophen every 4-6 hours or in combination with ibuprofen 400-600mg  every 6-8 hours as needed for pain and inflammation.  You may try wrapping your foot in an ace wrap or wearing a more supportive shoe such as a tennis shoe for the next 1-2 weeks.  If you are still having pain after that, you may call to schedule a follow up appointment with Sports Medicine.

## 2019-12-09 ENCOUNTER — Other Ambulatory Visit: Payer: Self-pay | Admitting: Obstetrics

## 2019-12-09 DIAGNOSIS — R928 Other abnormal and inconclusive findings on diagnostic imaging of breast: Secondary | ICD-10-CM

## 2019-12-10 ENCOUNTER — Other Ambulatory Visit: Payer: Self-pay

## 2019-12-10 ENCOUNTER — Ambulatory Visit
Admission: RE | Admit: 2019-12-10 | Discharge: 2019-12-10 | Disposition: A | Discharge status: Home / Self Care | Payer: BC Managed Care – PPO | Source: Ambulatory Visit | Attending: Obstetrics | Admitting: Obstetrics

## 2019-12-10 DIAGNOSIS — R928 Other abnormal and inconclusive findings on diagnostic imaging of breast: Secondary | ICD-10-CM

## 2022-04-09 IMAGING — US US BREAST*L* LIMITED INC AXILLA
1 series · 4 of 4 positions shown · non-contrast
Comparison: Previous exam(s).

CLINICAL DATA: Possible asymmetry in the posterior aspect of the
outer left breast on a recent screening mammogram. The patient's
mother was diagnosed with breast cancer at age 68.

EXAM:
DIGITAL DIAGNOSTIC LEFT MAMMOGRAM WITH CAD AND TOMO
ULTRASOUND LEFT BREAST

[Series 1: us breast*left* limited inc axilla · 0.08mm/px · 4 of 4 slices shown]
[im 1/4]
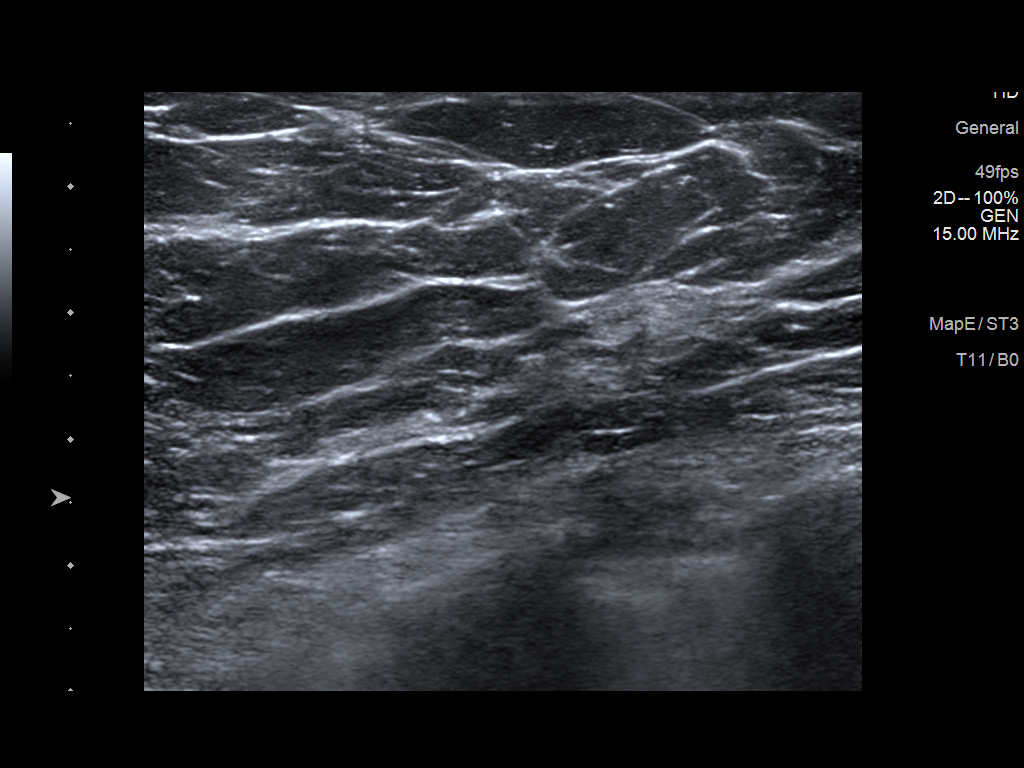
[im 2/4]
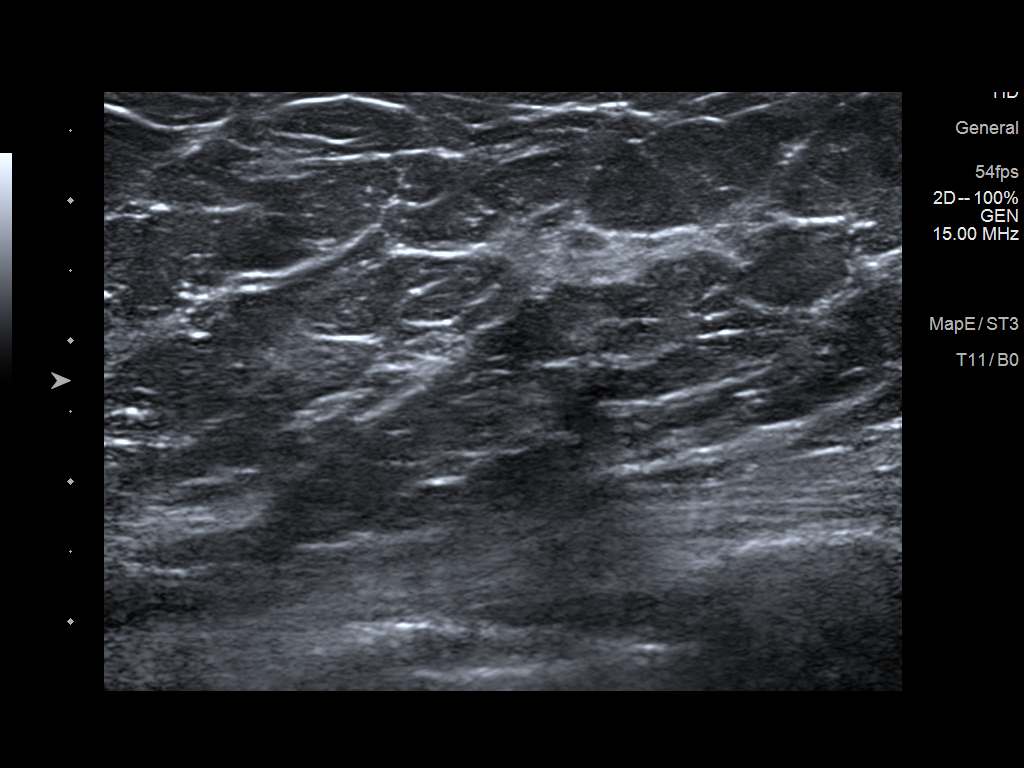
[im 3/4]
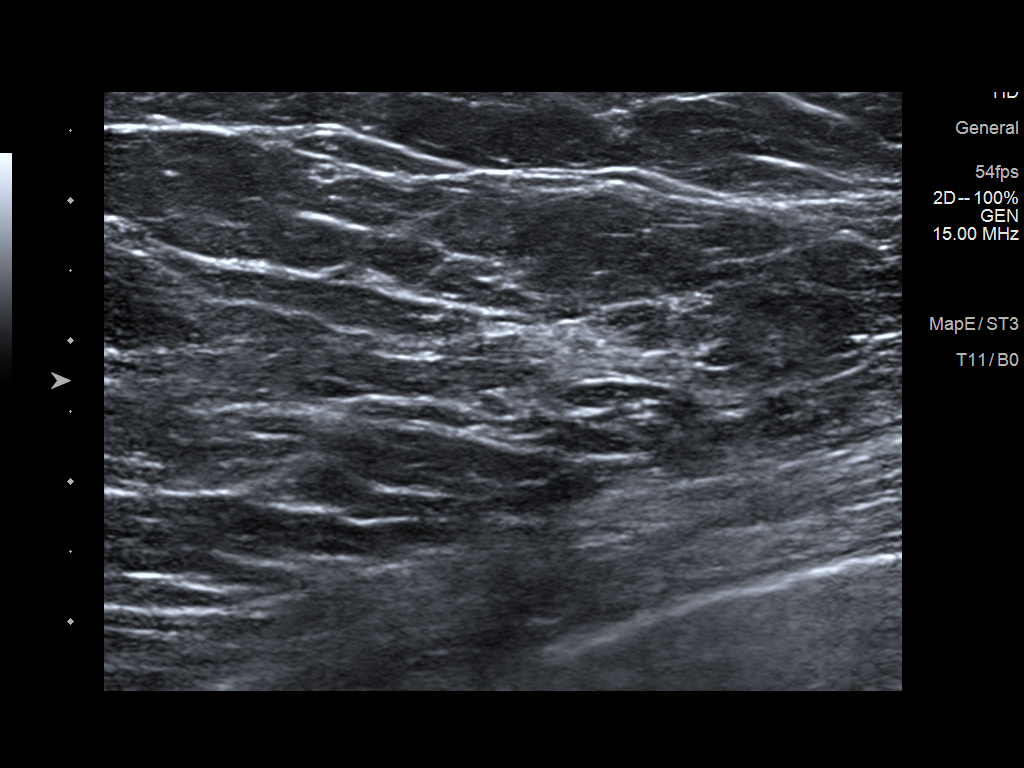
[im 4/4]
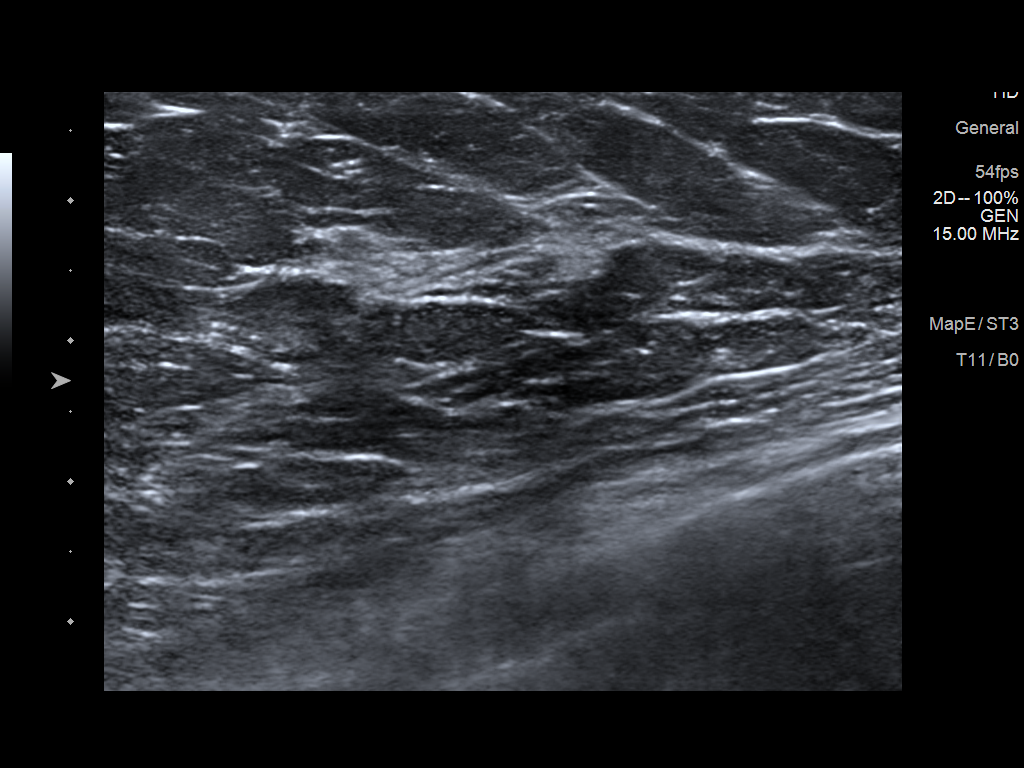

[4 of 4 positions shown; findings below may reference images not displayed]

ACR Breast Density Category b: There are scattered areas of
fibroglandular density.
FINDINGS: 3D tomographic and 2D generated true lateral, laterally exaggerated
craniocaudal and spot compression views of the left breast
demonstrate normal appearing fibroglandular tissue at the location
of the recently suspected asymmetry with an appearance similar to
that seen on 10/18/2015.

Mammographic images were processed with CAD.

On physical exam, no mass or abnormal thickening is palpable in the
outer left breast.

Targeted ultrasound is performed, showing a normal appearing
fibroglandular tissue throughout the outer left breast including an
island of fibroglandular tissue in the 3 o'clock position of the
breast, 6 cm from the nipple, corresponding to the suspected
mammographic asymmetry.
IMPRESSION: No evidence of malignancy.

RECOMMENDATION:
Bilateral screening mammogram in 1 year.

I have discussed the findings and recommendations with the patient.
If applicable, a reminder letter will be sent to the patient
regarding the next appointment.

BI-RADS CATEGORY  1: Negative.

## 2022-04-09 IMAGING — MG MM DIGITAL DIAGNOSTIC UNILAT*L* W/ TOMO W/ CAD
8 series · 8 of 24 positions shown · non-contrast
Comparison: Previous exam(s).

CLINICAL DATA: Possible asymmetry in the posterior aspect of the
outer left breast on a recent screening mammogram. The patient's
mother was diagnosed with breast cancer at age 68.

EXAM:
DIGITAL DIAGNOSTIC LEFT MAMMOGRAM WITH CAD AND TOMO
ULTRASOUND LEFT BREAST

[L XCCL synth-2D]
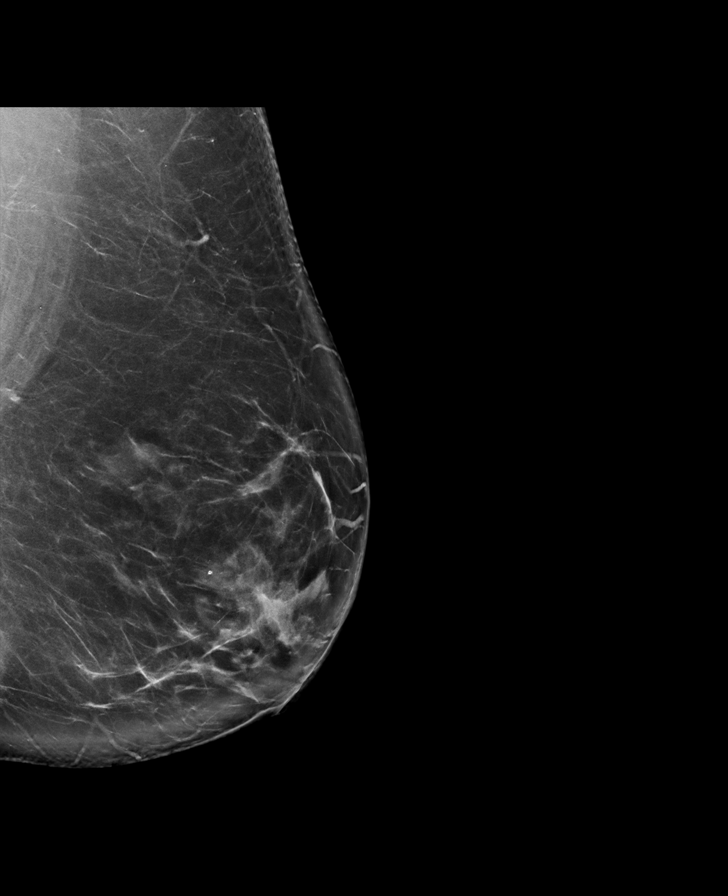

[L ML synth-2D]
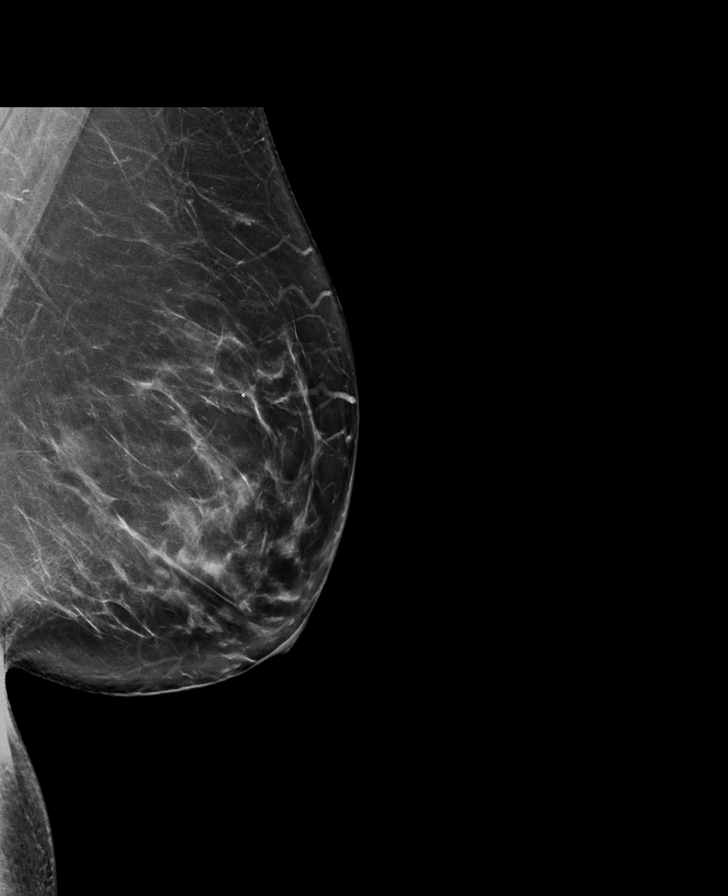

[L MLO synth-2D]
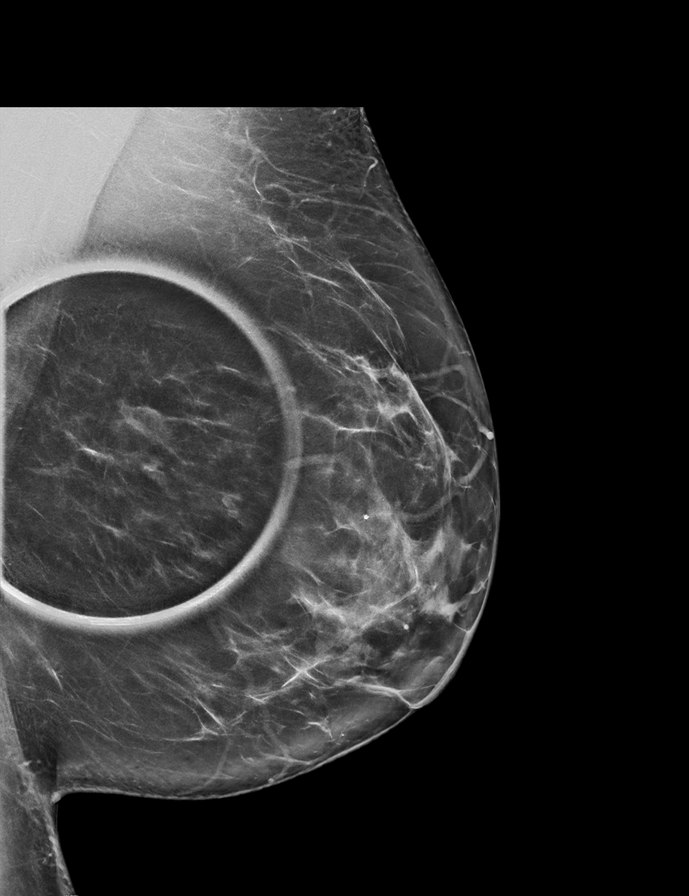

[L CC synth-2D]
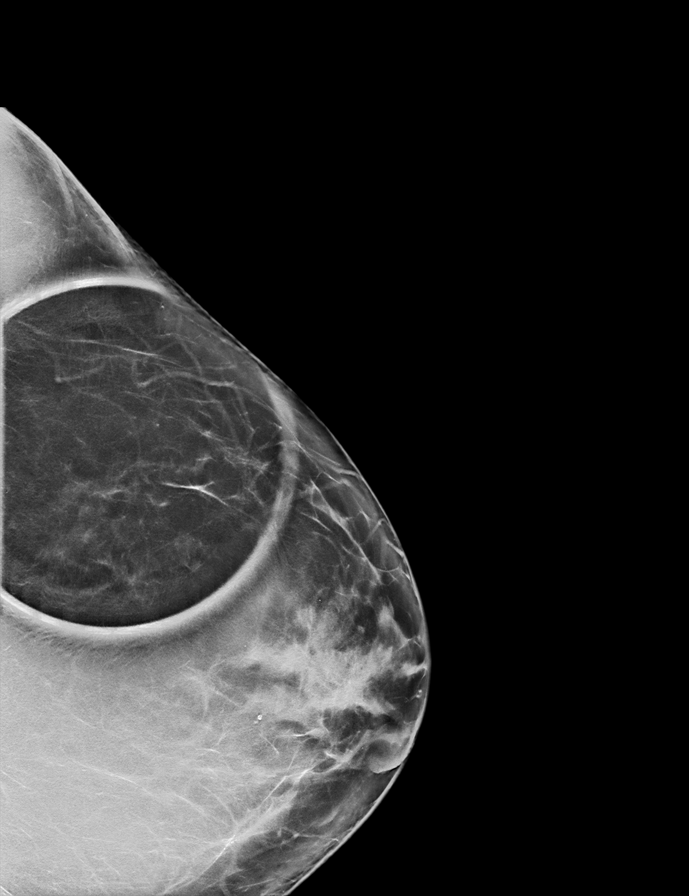

[L ML tomo · tomo slice 49/96.0]
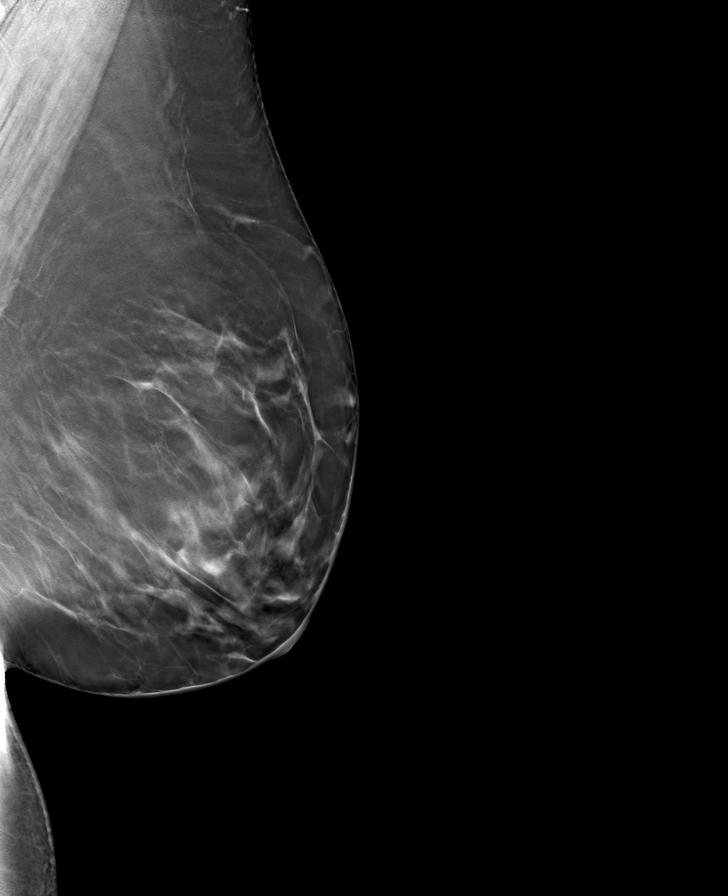

[L MLO tomo · tomo slice 44/87.0]
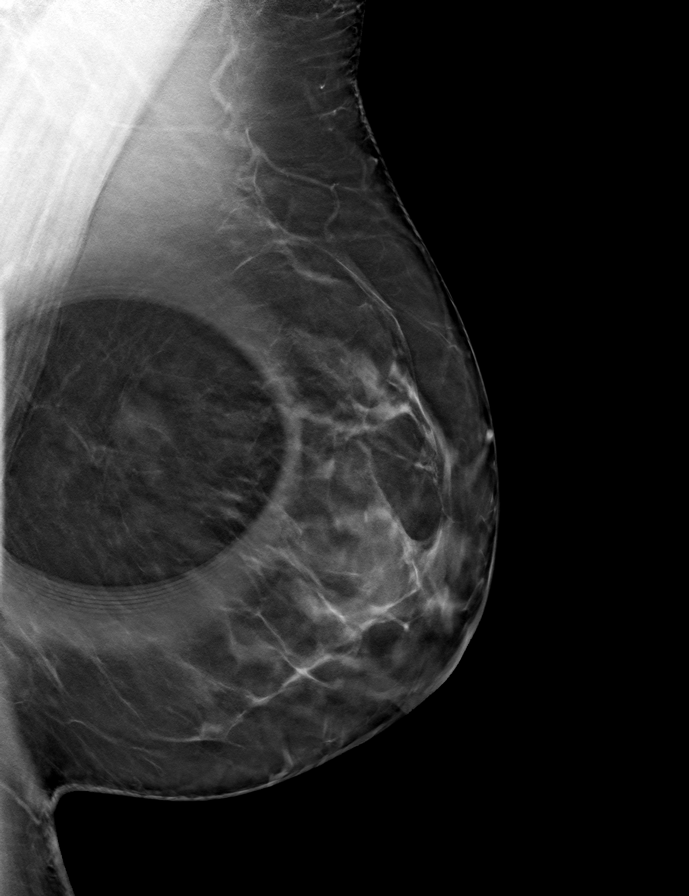

[L XCCL tomo · tomo slice 49/97.0]
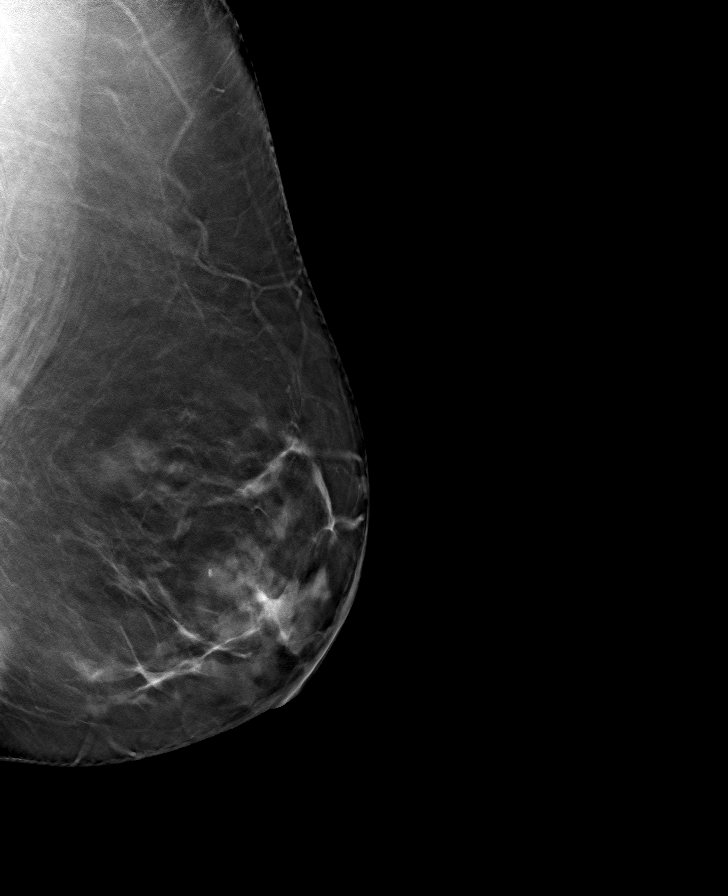

[L CC tomo · tomo slice 41/80.0]
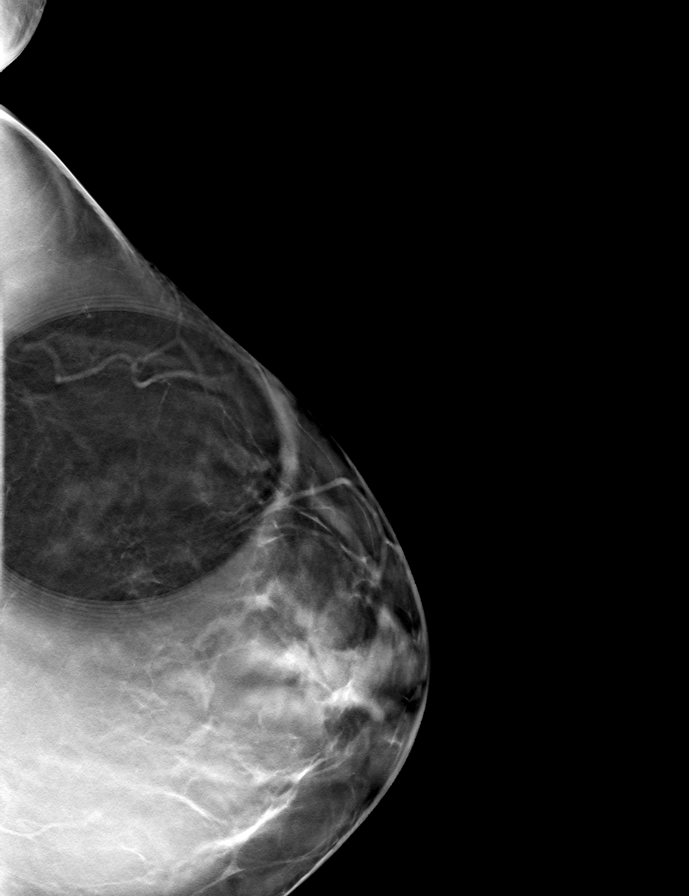

[8 of 24 positions shown; findings below may reference images not displayed]

ACR Breast Density Category b: There are scattered areas of
fibroglandular density.
FINDINGS: 3D tomographic and 2D generated true lateral, laterally exaggerated
craniocaudal and spot compression views of the left breast
demonstrate normal appearing fibroglandular tissue at the location
of the recently suspected asymmetry with an appearance similar to
that seen on 10/18/2015.

Mammographic images were processed with CAD.

On physical exam, no mass or abnormal thickening is palpable in the
outer left breast.

Targeted ultrasound is performed, showing a normal appearing
fibroglandular tissue throughout the outer left breast including an
island of fibroglandular tissue in the 3 o'clock position of the
breast, 6 cm from the nipple, corresponding to the suspected
mammographic asymmetry.
IMPRESSION: No evidence of malignancy.

RECOMMENDATION:
Bilateral screening mammogram in 1 year.

I have discussed the findings and recommendations with the patient.
If applicable, a reminder letter will be sent to the patient
regarding the next appointment.

BI-RADS CATEGORY  1: Negative.
